# Patient Record
Sex: Male | Born: 1985
Health system: Southern US, Community
[De-identification: ages and names within clinical notes are randomized; demographics above are authoritative.]

## PROBLEM LIST (undated history)

## (undated) DIAGNOSIS — T7840XA Allergy, unspecified, initial encounter: Secondary | ICD-10-CM

## (undated) DIAGNOSIS — J3089 Other allergic rhinitis: Secondary | ICD-10-CM

## (undated) HISTORY — DX: Allergy, unspecified, initial encounter: T78.40XA

---

## 2012-02-13 ENCOUNTER — Ambulatory Visit (INDEPENDENT_AMBULATORY_CARE_PROVIDER_SITE_OTHER): Payer: 59 | Admitting: Family Medicine

## 2012-02-13 VITALS — BP 104/65 | HR 76 | Temp 98.2°F | Resp 16 | Ht 73.0 in | Wt 161.0 lb

## 2012-02-13 DIAGNOSIS — L0201 Cutaneous abscess of face: Secondary | ICD-10-CM

## 2012-02-13 DIAGNOSIS — Z23 Encounter for immunization: Secondary | ICD-10-CM

## 2012-02-13 MED ORDER — DOXYCYCLINE HYCLATE 100 MG PO CAPS: 100.0000 mg | ORAL_CAPSULE | Freq: Two times a day (BID) | ORAL | Status: DC

## 2012-02-13 NOTE — Progress Notes (Signed)
  Patient Name: Howard Campbell Date of Birth: 01-Dec-1985 Medical Record Number: [id number] Gender: male Date of Encounter: 02/13/2012  History of Present Illness:  Howard Campbell is a 26 y.o. very pleasant male patient who presents with the following:  Noted an "abscess" on his left cheek about 5 days ago. He thought it started with an ingrown hair or bug bite.  It drained yesterday and he removed "meat and pus" from the inside and there was a crater left. No fevers, chills- otherwise feels ok.  Taking ibuprofen as needed. He has continued to get some purulent drainage from the wound over the last couple of days.   The area seems better now than it did the last couple of days There is no problem list on file for this patient.  No past medical history on file. No past surgical history on file. History  Substance Use Topics  . Smoking status: Never Smoker   . Smokeless tobacco: Not on file  . Alcohol Use: Yes     social   No family history on file. No Known Allergies  Medication list has been reviewed and updated.  Review of Systems: As per HPI- otherwise negative. Generally healthy Unsure when his last tetanus shot was but not in the last several years  Physical Examination: Filed Vitals:   02/13/12 1523  BP: 104/65  Pulse: 76  Temp: 98.2 F (36.8 C)  TempSrc: Oral  Resp: 16  Height: 6\' 1"  (1.854 m)  Weight: 161 lb (73.029 kg)    Body mass index is 21.24 kg/(m^2).  GEN: WDWN, NAD, Non-toxic, A & O x 3 HEENT: Atraumatic, Normocephalic. Neck supple. No masses, No LAD.  Oropharynx wnl.  Left check shows a draining abscess. Removed band- aid and was able to express a small amount of pus from the wound.  There is a small defect in the skin but not enough space to place packing Ears and Nose: No external deformity. EXTR: No c/c/e NEURO Normal gait.  PSYCH: Normally interactive. Conversant. Not depressed or anxious appearing.  Calm demeanor.   Wound culture  collected Assessment and Plan: 1. Abscess of external cheek, left  doxycycline (VIBRAMYCIN) 100 MG capsule, Wound culture, Tdap vaccine greater than or equal to 7yo IM   Continue hot compresses several times daily, may squeeze GENTLY to remove any pus.  Take medication as directed and keep wound covered. Let me know if not continuing to improve, and we will contact him with wound culture results when available.

## 2012-02-17 LAB — WOUND CULTURE: Gram Stain: NONE SEEN

## 2012-08-30 ENCOUNTER — Other Ambulatory Visit: Payer: Self-pay | Admitting: Family Medicine

## 2012-08-30 NOTE — Telephone Encounter (Signed)
Please pull chart.

## 2012-08-31 NOTE — Telephone Encounter (Signed)
Chart pulled to PA pool DOS 05/17/11

## 2013-01-12 ENCOUNTER — Other Ambulatory Visit: Payer: Self-pay | Admitting: Family Medicine

## 2013-06-08 ENCOUNTER — Other Ambulatory Visit: Payer: Self-pay | Admitting: Physician Assistant

## 2013-06-08 NOTE — Telephone Encounter (Signed)
Needs OV.  No further RFs.  Last seen 2013

## 2013-10-29 ENCOUNTER — Other Ambulatory Visit: Payer: Self-pay | Admitting: Family Medicine

## 2013-11-16 ENCOUNTER — Other Ambulatory Visit: Payer: Self-pay | Admitting: Family Medicine

## 2014-01-05 ENCOUNTER — Other Ambulatory Visit: Payer: Self-pay | Admitting: Family Medicine

## 2014-01-07 ENCOUNTER — Telehealth: Payer: Self-pay

## 2014-01-07 NOTE — Telephone Encounter (Signed)
Patient is calling to get a refill on: acyclovir (ZOVIRAX) 400 MG tablet He was told by the Dr. He last saw that he can just call to get this refilled with no office visit.   Pharmacy: [facility]   Best: [phone]

## 2014-01-08 ENCOUNTER — Ambulatory Visit (INDEPENDENT_AMBULATORY_CARE_PROVIDER_SITE_OTHER): Payer: 59 | Admitting: Family Medicine

## 2014-01-08 VITALS — BP 124/76 | HR 70 | Temp 98.4°F | Resp 17 | Ht 72.0 in | Wt 146.0 lb

## 2014-01-08 DIAGNOSIS — A609 Anogenital herpesviral infection, unspecified: Secondary | ICD-10-CM

## 2014-01-08 DIAGNOSIS — B009 Herpesviral infection, unspecified: Secondary | ICD-10-CM

## 2014-01-08 MED ORDER — ACYCLOVIR 400 MG PO TABS: ORAL_TABLET | ORAL | Status: DC

## 2014-01-08 NOTE — Telephone Encounter (Signed)
Explained to pt that we can RF as long as he is seen at least once a year and is a current pt.  Provider needs to do general exam to make sure pt is healthy to Rx any medication. It has been almost 2 yrs since OV. Pt agreed to come in.

## 2014-01-08 NOTE — Progress Notes (Signed)
° °  Subjective:  This chart was scribed for Elvina SidleKurt Lauenstein, MD by Carl Bestelina Holson, Medical Scribe. This patient was seen in Room 12 and the patient's care was started at 5:56 PM.   Patient ID: Howard Campbell, male    DOB: 07/14/1986, 28 y.o.   MRN: [id number]  HPI HPI Comments: Howard Campbell is a 28 y.o. male who presents to the [facility] needing a refill for his Zovirax prescription.  Past Medical History  Diagnosis Date   Allergy    History reviewed. No pertinent past surgical history. History reviewed. No pertinent family history. History   Social History   Marital Status: Single    Spouse Name: N/A    Number of Children: N/A   Years of Education: N/A   Occupational History   Not on file.   Social History Main Topics   Smoking status: Never Smoker    Smokeless tobacco: Not on file   Alcohol Use: Yes     Comment: social   Drug Use: No   Sexual Activity: No   Other Topics Concern   Not on file   Social History Narrative   No narrative on file   No Known Allergies  Review of Systems  All other systems reviewed and are negative.     Objective:  Physical Exam     BP 124/76   Pulse 70   Temp(Src) 98.4 F (36.9 C) (Oral)   Resp 17   Ht 6' (1.829 m)   Wt 146 lb (66.225 kg)   BMI 19.80 kg/m2   SpO2 100% Assessment & Plan:  I personally performed the services described in this documentation, which was scribed in my presence. The recorded information has been reviewed and is accurate.  HSV (herpes simplex virus) anogenital infection  Signed, Elvina SidleKurt Lauenstein, MD

## 2014-11-28 ENCOUNTER — Ambulatory Visit (INDEPENDENT_AMBULATORY_CARE_PROVIDER_SITE_OTHER): Payer: BLUE CROSS/BLUE SHIELD | Admitting: Physician Assistant

## 2014-11-28 VITALS — BP 104/72 | HR 75 | Temp 98.3°F | Resp 16 | Ht 71.5 in | Wt 160.2 lb

## 2014-11-28 DIAGNOSIS — R1032 Left lower quadrant pain: Secondary | ICD-10-CM

## 2014-11-28 DIAGNOSIS — R109 Unspecified abdominal pain: Secondary | ICD-10-CM

## 2014-11-28 DIAGNOSIS — R369 Urethral discharge, unspecified: Secondary | ICD-10-CM

## 2014-11-28 DIAGNOSIS — A609 Anogenital herpesviral infection, unspecified: Secondary | ICD-10-CM

## 2014-11-28 DIAGNOSIS — R300 Dysuria: Secondary | ICD-10-CM

## 2014-11-28 LAB — POCT URINALYSIS DIPSTICK
BILIRUBIN UA: NEGATIVE
Blood, UA: NEGATIVE
GLUCOSE UA: NEGATIVE
KETONES UA: NEGATIVE
LEUKOCYTES UA: NEGATIVE
Nitrite, UA: NEGATIVE
PROTEIN UA: NEGATIVE
Spec Grav, UA: 1.025
Urobilinogen, UA: 0.2
pH, UA: 7

## 2014-11-28 LAB — CBC WITH DIFFERENTIAL/PLATELET
BASOS ABS: 0 10*3/uL (ref 0.0–0.1)
BASOS PCT: 0 % (ref 0–1)
EOS PCT: 1 % (ref 0–5)
Eosinophils Absolute: 0.1 10*3/uL (ref 0.0–0.7)
HCT: 46.9 % (ref 39.0–52.0)
Hemoglobin: 15.5 g/dL (ref 13.0–17.0)
Lymphocytes Relative: 28 % (ref 12–46)
Lymphs Abs: 1.5 10*3/uL (ref 0.7–4.0)
MCH: 27.2 pg (ref 26.0–34.0)
MCHC: 33 g/dL (ref 30.0–36.0)
MCV: 82.4 fL (ref 78.0–100.0)
MPV: 11.2 fL (ref 8.6–12.4)
Monocytes Absolute: 0.5 10*3/uL (ref 0.1–1.0)
Monocytes Relative: 9 % (ref 3–12)
Neutro Abs: 3.3 10*3/uL (ref 1.7–7.7)
Neutrophils Relative %: 62 % (ref 43–77)
PLATELETS: 212 10*3/uL (ref 150–400)
RBC: 5.69 MIL/uL (ref 4.22–5.81)
RDW: 14.3 % (ref 11.5–15.5)
WBC: 5.3 10*3/uL (ref 4.0–10.5)

## 2014-11-28 LAB — POCT UA - MICROSCOPIC ONLY
BACTERIA, U MICROSCOPIC: NEGATIVE
CASTS, UR, LPF, POC: NEGATIVE
CRYSTALS, UR, HPF, POC: NEGATIVE
EPITHELIAL CELLS, URINE PER MICROSCOPY: NEGATIVE
Mucus, UA: NEGATIVE
Yeast, UA: NEGATIVE

## 2014-11-28 LAB — COMPREHENSIVE METABOLIC PANEL
ALK PHOS: 41 U/L (ref 39–117)
ALT: 15 U/L (ref 0–53)
AST: 15 U/L (ref 0–37)
Albumin: 4.7 g/dL (ref 3.5–5.2)
BUN: 11 mg/dL (ref 6–23)
CALCIUM: 9.8 mg/dL (ref 8.4–10.5)
CHLORIDE: 103 meq/L (ref 96–112)
CO2: 32 mEq/L (ref 19–32)
CREATININE: 1.08 mg/dL (ref 0.50–1.35)
Glucose, Bld: 60 mg/dL — ABNORMAL LOW (ref 70–99)
Potassium: 4.2 mEq/L (ref 3.5–5.3)
Sodium: 141 mEq/L (ref 135–145)
Total Bilirubin: 0.6 mg/dL (ref 0.2–1.2)
Total Protein: 7.5 g/dL (ref 6.0–8.3)

## 2014-11-28 MED ORDER — ACYCLOVIR 400 MG PO TABS: ORAL_TABLET | ORAL | Status: DC

## 2014-11-28 NOTE — Progress Notes (Signed)
Subjective:    Patient ID: Howard BruinsShawn F Campbell, male    DOB: [birth year], 29 y.o.   MRN: [id number]  Chief Complaint  Patient presents with  . Abdominal Pain    x2-3 weeks; on left side   . Flank Pain  . Dysuria  . Urinary Frequency  . Penile Discharge    states his discharge is white   There are no active problems to display for this patient.  Prior to Admission medications   Medication Sig Start Date End Date Taking? Authorizing Provider  acyclovir (ZOVIRAX) 400 MG tablet Take 1 tab by mouth 1-2 times per day. 11/28/14  Yes Raelyn Ensignodd Osborne Serio, PA   Medications, allergies, past medical history, surgical history, family history, social history and problem list reviewed and updated.  HPI  [age] yom with pmh herpes simplex infx 6 yrs ago presents with 3 wk h/o left sided abd pain, flank pain, dysuria, and white penile dc.  He actually states sx started approx 4 months ago with llq pain. This has been constant for four months. No change with eating, bm, movement, etc. Rated 3/10. No radiation. He has not taken anything for the pain. He has also had bilateral low back pain for approx the same time. This is intermittent and lasts for hrs to days when it comes on. No radiation. No aggravating, relieving factors.   Had one episode white penile dc one month ago. Was also having itchiness with urination during this time. He took 3 days of his wife's doxy which relieved to sx. Sx returned few days later, took doxy for 5 days and sx resolved until yest.   Since yest having white penile dc, penile itching while peeing. No dysuria. Mild increased freq. No straining, no urgency. No increased thirst.   Denies constipation, diarrhea, fever, chills. Has not been lifting weights. Denies erectile probs.   Has hx genital herpes infx 6 yrs ago. Has used acyclovir intermittently since. Needs refill today.   He is married and initially states wife is only partner. At end of interview he clarifies that he has been  having oral sex for several months with another partner.   Review of Systems No cp, sob. No abd pain.     Objective:   Physical Exam  Constitutional: He is oriented to person, place, and time. He appears well-developed and well-nourished.  Non-toxic appearance. He does not have a sickly appearance. He does not appear ill. No distress.  BP 104/72 mmHg  Pulse 75  Temp(Src) 98.3 F (36.8 C) (Oral)  Resp 16  Ht 5' 11.5" (1.816 m)  Wt 160 lb 3.2 oz (72.666 kg)  BMI 22.03 kg/m2  SpO2 100%   Cardiovascular: Normal rate, regular rhythm and normal heart sounds.   Pulmonary/Chest: Effort normal and breath sounds normal.  Abdominal: Soft. Normal appearance and bowel sounds are normal. He exhibits no mass. There is no hepatosplenomegaly. There is no tenderness. There is no rigidity, no rebound, no guarding, no CVA tenderness, no tenderness at McBurney's point and negative Murphy's sign. No hernia. Hernia confirmed negative in the right inguinal area and confirmed negative in the left inguinal area.  No ttp llq.   Genitourinary: Testes normal. Right testis shows no swelling and no tenderness. Left testis shows no swelling and no tenderness. Discharge found.  Small amnt white dc at tip of penis.   Musculoskeletal:       Lumbar back: Normal. He exhibits normal range of motion, no tenderness, no bony tenderness, no deformity  and no spasm.  No ttp over area that he states has been sore.   Lymphadenopathy:       Right: No inguinal adenopathy present.       Left: No inguinal adenopathy present.  Neurological: He is alert and oriented to person, place, and time.  Psychiatric: He has a normal mood and affect. His speech is normal.   Results for orders placed or performed in visit on 11/28/14  POCT UA - Microscopic Only  Result Value Ref Range   WBC, Ur, HPF, POC 0-1    RBC, urine, microscopic 4-8    Bacteria, U Microscopic neg    Mucus, UA neg    Epithelial cells, urine per micros neg     Crystals, Ur, HPF, POC neg    Casts, Ur, LPF, POC neg    Yeast, UA neg   POCT urinalysis dipstick  Result Value Ref Range   Color, UA yellow    Clarity, UA clear    Glucose, UA neg    Bilirubin, UA neg    Ketones, UA neg    Spec Grav, UA 1.025    Blood, UA neg    pH, UA 7.0    Protein, UA neg    Urobilinogen, UA 0.2    Nitrite, UA neg    Leukocytes, UA Negative       Assessment & Plan:   67 yom with pmh herpes simplex infx 6 yrs ago presents with 3 wk h/o left sided abd pain, flank pain, dysuria, and white penile dc.  Dysuria - Plan: POCT UA - Microscopic Only, POCT urinalysis dipstick, CBC with Differential/Platelet --actually more penile itching and very mild increased freq --neg ua  Flank pain - Plan: POCT UA - Microscopic Only, POCT urinalysis dipstick, Comprehensive metabolic panel, CBC with Differential/Platelet Abdominal pain, left lower quadrant - Plan: Comprehensive metabolic panel, CBC with Differential/Platelet --doubt pyelo, stone as pain is bilateral, normal ua and pain is bilateral --low concern with abd pain as abd exam is completely benign  --await cmp, cbc  Penile discharge - Plan: GC/Chlamydia Probe Amp --await gc/ct --if neg may tx with flagyl for poss trich infx, tx wife as well   HSV (herpes simplex virus) anogenital infection - Plan: acyclovir (ZOVIRAX) 400 MG tablet --no current outbreak --refill given  Donnajean Lopes, PA-C Physician Assistant-Certified [facility]  11/28/2014 6:21 PM

## 2014-11-28 NOTE — Patient Instructions (Signed)
Your urine sample was negative for a urinary tract infection. We tested you for chlamydia and gonorrhea today and I'll be in contact with you with those results. If they are negative I may send in an antibiotic to treat you for something called trich. I'll let you know if I do this.  We drew labs today checking your liver, kidneys, blood sugar, electrolytes, and blood cells. I'll let you know the results of this.  If you change your mind about HIV, syphilis testing please let us know.

## 2014-11-29 ENCOUNTER — Ambulatory Visit (INDEPENDENT_AMBULATORY_CARE_PROVIDER_SITE_OTHER): Payer: BLUE CROSS/BLUE SHIELD | Admitting: Physician Assistant

## 2014-11-29 ENCOUNTER — Telehealth: Payer: Self-pay | Admitting: Physician Assistant

## 2014-11-29 ENCOUNTER — Other Ambulatory Visit: Payer: Self-pay | Admitting: Physician Assistant

## 2014-11-29 VITALS — HR 64 | Temp 97.8°F | Resp 16 | Ht 72.0 in | Wt 159.0 lb

## 2014-11-29 DIAGNOSIS — A549 Gonococcal infection, unspecified: Secondary | ICD-10-CM

## 2014-11-29 DIAGNOSIS — Z113 Encounter for screening for infections with a predominantly sexual mode of transmission: Secondary | ICD-10-CM

## 2014-11-29 LAB — GC/CHLAMYDIA PROBE AMP
CT Probe RNA: NEGATIVE
GC Probe RNA: POSITIVE — AB

## 2014-11-29 MED ORDER — CEFTRIAXONE SODIUM 250 MG IJ SOLR: 250.0000 mg | Freq: Once | INTRAMUSCULAR | Status: DC

## 2014-11-29 MED ORDER — CEFTRIAXONE SODIUM 1 G IJ SOLR
250.0000 mg | Freq: Once | INTRAMUSCULAR | Status: AC
  Administered 2014-11-29: 250 mg via INTRAMUSCULAR

## 2014-11-29 MED ORDER — AZITHROMYCIN 250 MG PO TABS: ORAL_TABLET | ORAL | Status: DC

## 2014-11-29 NOTE — Telephone Encounter (Signed)
Lab - pls send health dept card.   Called and spoke with pt. His gonorrhea test was positive. Instructed to rtc today for IM ceft and oral azithro. Pt instructed to inform partners and have them come in to be tx. Pt instructed that we encourage HIV/syphilis testing and he is agreeable to this, will check today. Pt instructed to withhold from sex for one week post treatment. Pt instructed to rtc 3 days if sx don't resolve. Pt instructed to rtc 3 months for recheck. Pt expressed understanding and is agreeable.

## 2014-11-29 NOTE — Progress Notes (Signed)
Patient here for treatment of gonorrhea in light of positive uri probe.  Received 250 mg IM ceftriaxone. Chlamydia negative.  Patient STI panel completed today with lab work for HIV, syphilis, and Hep C testing.  Will follow those results.  Deliah BostonMichael Kayleeann Huxford, MS, PA-C   5:17 PM, 11/29/2014

## 2014-11-30 LAB — RPR

## 2014-11-30 LAB — HIV ANTIBODY (ROUTINE TESTING W REFLEX): HIV 1&2 Ab, 4th Generation: NONREACTIVE

## 2014-11-30 LAB — HEPATITIS C ANTIBODY: HCV Ab: NEGATIVE

## 2014-11-30 NOTE — Telephone Encounter (Signed)
[facility] form faxed.

## 2016-10-15 ENCOUNTER — Encounter: Payer: Self-pay | Admitting: Physician Assistant

## 2016-10-15 ENCOUNTER — Ambulatory Visit (INDEPENDENT_AMBULATORY_CARE_PROVIDER_SITE_OTHER): Payer: Managed Care, Other (non HMO) | Admitting: Physician Assistant

## 2016-10-15 VITALS — BP 112/76 | HR 68 | Temp 98.6°F | Resp 18 | Ht 72.0 in | Wt 167.0 lb

## 2016-10-15 DIAGNOSIS — A609 Anogenital herpesviral infection, unspecified: Secondary | ICD-10-CM

## 2016-10-15 DIAGNOSIS — R6889 Other general symptoms and signs: Secondary | ICD-10-CM

## 2016-10-15 LAB — POCT URINALYSIS DIP (MANUAL ENTRY)
Bilirubin, UA: NEGATIVE
Blood, UA: NEGATIVE
Glucose, UA: NEGATIVE
Ketones, POC UA: NEGATIVE
Leukocytes, UA: NEGATIVE
Nitrite, UA: NEGATIVE
Protein Ur, POC: NEGATIVE
Spec Grav, UA: >=1.03
Urobilinogen, UA: 0.2
pH, UA: 5.5

## 2016-10-15 LAB — POC MICROSCOPIC URINALYSIS (UMFC): Mucus: ABSENT

## 2016-10-15 MED ORDER — ACYCLOVIR 400 MG PO TABS: ORAL_TABLET | ORAL | 3 refills | Status: DC

## 2016-10-15 NOTE — Patient Instructions (Addendum)
Please be sure to practice safe sex: Use condoms.  Please stay well hydrated, drink at least 2 liters of water a day.  I will contact you with your results when they come back.    Thank you for coming in today. I hope you feel we met your needs.  Feel free to call [facility] if you have any questions or further requests.  Please consider signing up for MyChart if you do not already have it, as this is a great way to communicate with me.  Best,  Whitney McVey, PA-C   IF you received an x-ray today, you will receive an invoice from [facility]. Please contact [facility] at [phone] with questions or concerns regarding your invoice.   IF you received labwork today, you will receive an invoice from [facility]. Please contact [facility] at [phone] with questions or concerns regarding your invoice.   Our billing staff will not be able to assist you with questions regarding bills from these companies.  You will be contacted with the lab results as soon as they are available. The fastest way to get your results is to activate your My Chart account. Instructions are located on the last page of this paperwork. If you have not heard from [place] regarding the results in 2 weeks, please contact this office.

## 2016-10-15 NOTE — Progress Notes (Signed)
Howard Campbell  MRN: [id number] DOB: Jan 20, 1986  PCP: Howard Campbell  Subjective:  Pt is a 30 year old male who presents to clinic for STD testing. For the past few weeks he has experiences intermittent "cold or wet" feeling of his testes and penis, happens most often after he urinates, sometimes after he has been sitting for a while. Is not constant. This has happened to him a few years ago, his symptoms went away on their own. Endorses some mild lower left abdominal pain, however he works out a lot and thinks this could perhaps be from exercising.  He has intercourse with one partner, uses condoms. Has tested positive and treated for STIs in the past. He states his current symptoms are unlike those in the past when he has STI.  Denies fever, chills, discharge from penis, testicular pain, penile pain, low back pain, flank pain, blood in urine.   Review of Systems  Constitutional: Negative for chills, diaphoresis and fever.  Respiratory: Negative for cough, chest tightness, shortness of breath and wheezing.   Cardiovascular: Negative for chest pain and palpitations.  Gastrointestinal: Negative for diarrhea, nausea and vomiting.  Genitourinary: Negative for decreased urine volume, difficulty urinating, discharge, dysuria, enuresis, flank pain, frequency, penile pain, penile swelling, scrotal swelling, testicular pain and urgency.  Musculoskeletal: Negative for joint swelling.  Neurological: Negative for dizziness, syncope, light-headedness and headaches.    Patient Active Problem List   Diagnosis Date Noted  . Gonorrhea 11/29/2014    Current Outpatient Prescriptions on File Prior to Visit  Medication Sig Dispense Refill  . acyclovir (ZOVIRAX) 400 MG tablet Take 1 tab by mouth 1-2 times per day. 90 tablet 3   No current facility-administered medications on file prior to visit.     No Known Allergies   Objective:  BP 112/76 (BP Location: Right Arm, Patient Position:  Sitting, Cuff Size: Small)   Pulse 68   Temp 98.6 F (37 C) (Oral)   Resp 18   Ht 6' (1.829 m)   Wt 167 lb (75.8 kg)   SpO2 100%   BMI 22.65 kg/m   Physical Exam  Constitutional: He is oriented to person, place, and time and well-developed, well-nourished, and in no distress. No distress.  Cardiovascular: Normal rate, regular rhythm and normal heart sounds.   Pulmonary/Chest: Effort normal. No respiratory distress.  Neurological: He is alert and oriented to person, place, and time. GCS score is 15.  Skin: Skin is warm and dry.  Psychiatric: Mood, memory, affect and judgment normal.  Vitals reviewed.  Results for orders placed or performed in visit on 10/15/16  GC/Chlamydia Probe Amp  Result Value Ref Range   Chlamydia trachomatis, NAA Negative Negative   Neisseria gonorrhoeae by PCR Negative Negative  Urine culture  Result Value Ref Range   Urine Culture, Routine Final report    Urine Culture result 1 No growth   PSA  Result Value Ref Range   Prostate Specific Ag, Serum 0.8 0.0 - 4.0 ng/mL  HIV antibody (with reflex)  Result Value Ref Range   HIV Screen 4th Generation wRfx Non Reactive Non Reactive  Testosterone  Result Value Ref Range   Testosterone 696 264 - 916 ng/dL  POCT urinalysis dipstick  Result Value Ref Range   Color, UA yellow yellow   Clarity, UA clear clear   Glucose, UA negative negative   Bilirubin, UA negative negative   Ketones, POC UA negative negative   Spec Grav, UA >=1.030  Blood, UA negative negative   pH, UA 5.5    Protein Ur, POC negative negative   Urobilinogen, UA 0.2    Nitrite, UA Negative Negative   Leukocytes, UA Negative Negative  POCT Microscopic Urinalysis ([facility])  Result Value Ref Range   WBC,UR,HPF,POC None None WBC/hpf   RBC,UR,HPF,POC None None RBC/hpf   Bacteria None None, Too numerous to count   Mucus Absent Absent   Epithelial Cells, UR Per Microscopy Few (A) None, Too numerous to count cells/hpf    Assessment and  Plan :  1. Sensation of feeling cold - GC/Chlamydia Probe Amp - POCT urinalysis dipstick - POCT Microscopic Urinalysis ([facility]) - PSA - Urine culture - HIV antibody (with reflex) - Testosterone - No concern at this time for prostatitis or UTI. Labs pending, will contact with results.   2. HSV (herpes simplex virus) anogenital infection - acyclovir (ZOVIRAX) 400 MG tablet; Take 1 tab by mouth 1-2 times per day.  Dispense: 90 tablet; Refill: 3    Howard CollieWhitney Kiandria Clum, PA-C  [facility] 10/15/2016 8:20 AM

## 2016-10-16 LAB — HIV ANTIBODY (ROUTINE TESTING W REFLEX): HIV Screen 4th Generation wRfx: NONREACTIVE

## 2016-10-16 LAB — TESTOSTERONE: Testosterone: 696 ng/dL (ref 264–916)

## 2016-10-16 LAB — PSA: Prostate Specific Ag, Serum: 0.8 ng/mL (ref 0.0–4.0)

## 2016-10-17 LAB — GC/CHLAMYDIA PROBE AMP
Chlamydia trachomatis, NAA: NEGATIVE
Neisseria gonorrhoeae by PCR: NEGATIVE

## 2016-10-17 LAB — URINE CULTURE: Organism ID, Bacteria: NO GROWTH

## 2016-10-20 ENCOUNTER — Telehealth: Payer: Self-pay

## 2016-10-20 NOTE — Telephone Encounter (Signed)
Pt requesting lab results Please post

## 2016-10-20 NOTE — Telephone Encounter (Signed)
Pt is looking for lab results   Best number [phone]

## 2016-10-21 NOTE — Telephone Encounter (Signed)
PATIENT STATES HE WAS SEEN A WEEK AGO AND HAD SOME LAB WORK DONE. HE SAW ELIZABETH MCVEY. HE SAID HE WAS TOLD HE WOULD GET A CALL REGARDING HIS LAB RESULTS THE NEXT DAY. HE SAID HE HAS BEEN WAITING AND CALLING AND NO ONE HAS CALLED HIM BACK  HE WOULD LIKE TO GO AHEAD AND GET ON MEDICATION IF HE NEEDS TO. BEST PHONE [phone] (CELL)  [facility] [address]

## 2016-10-22 ENCOUNTER — Encounter: Payer: Self-pay | Admitting: Physician Assistant

## 2016-10-22 NOTE — Telephone Encounter (Signed)
I told pt I'd call if +results. Please call and let him know all lab work is negative. No need to treat. There is a letter in the mail with all of his results.

## 2016-10-23 NOTE — Telephone Encounter (Signed)
Pt advised.

## 2016-12-14 ENCOUNTER — Telehealth: Payer: Self-pay

## 2016-12-14 NOTE — Telephone Encounter (Signed)
Pt still feels like he has Chlamydia even though his lab results were negative.  His partner told him she has it. He would like a script for this. He would like a CB, he doesn't want to come in for an OV. I tried to explain he would need to be seen. Please advise at [phone]

## 2016-12-15 ENCOUNTER — Ambulatory Visit (INDEPENDENT_AMBULATORY_CARE_PROVIDER_SITE_OTHER): Payer: 59 | Admitting: Family Medicine

## 2016-12-15 VITALS — BP 156/63 | HR 62 | Temp 98.3°F | Ht 72.0 in | Wt 168.8 lb

## 2016-12-15 DIAGNOSIS — Z202 Contact with and (suspected) exposure to infections with a predominantly sexual mode of transmission: Secondary | ICD-10-CM

## 2016-12-15 LAB — POCT URINALYSIS DIP (MANUAL ENTRY)
BILIRUBIN UA: NEGATIVE
Blood, UA: NEGATIVE
Glucose, UA: NEGATIVE
Ketones, POC UA: NEGATIVE
LEUKOCYTES UA: NEGATIVE
NITRITE UA: NEGATIVE
PH UA: 7
Protein Ur, POC: 30 — AB
Spec Grav, UA: 1.02
Urobilinogen, UA: 0.2

## 2016-12-15 MED ORDER — AZITHROMYCIN 500 MG PO TABS: ORAL_TABLET | ORAL | 0 refills | Status: DC

## 2016-12-15 NOTE — Telephone Encounter (Signed)
I've reviewed the chart.  I will see him today in clinic anytime from 1-7 or all day Saturday. No antibiotics over the phone. Deliah BostonMichael Thoms Barthelemy, MS, PA-C 10:15 AM, 12/15/2016

## 2016-12-15 NOTE — Patient Instructions (Addendum)
Azithromycin 500 mg 2 tablets once.  You will be notified within one week of your lab results.    IF you received an x-ray today, you will receive an invoice from [facility]. Please contact [facility] at [phone] with questions or concerns regarding your invoice.   IF you received labwork today, you will receive an invoice from [facility]. Please contact [facility] at [phone] with questions or concerns regarding your invoice.   Our billing staff will not be able to assist you with questions regarding bills from these companies.  You will be contacted with the lab results as soon as they are available. The fastest way to get your results is to activate your My Chart account. Instructions are located on the last page of this paperwork. If you have not heard from us regarding the results in 2 weeks, please contact this office.     Chlamydia, Male Chlamydia is an STD (sexually transmitted disease). It is a bacterial infection that spreads through sexual contact (is contagious). Chlamydia can occur in different areas of the body, including the tube that moves urine from the bladder out of the body (urethra), the throat, or the rectum. This condition is not difficult to treat. However, if left untreated, chlamydia can lead to more serious health problems. What are the causes? Chlamydia is caused by the bacteria Chlamydia trachomatis. It is passed from an infected partner during sexual activity. Chlamydia can spread through contact with the genitals, mouth, or rectum. What are the signs or symptoms? In some cases, there may not be any symptoms for this condition (asymptomatic), especially early in the infection. If symptoms develop, they may include:  Burning when urinating.  Urinating frequently.  Pain or swelling in the testicles.  Watery, mucus-like discharge from the penis.  Redness, soreness, and swelling (inflammation) of the rectum.  Bleeding or discharge  from the rectum.  Abdominal pain.  Itching, burning, or redness in the eyes, or discharge from the eyes. How is this diagnosed? This condition may be diagnosed based on:  Urine tests.  Swab tests. Depending on your symptoms, your health care provider may use a cotton swab to collect discharge from your urethra or rectum to test for the bacteria. How is this treated? This condition is treated with antibiotic medicines. Follow these instructions at home: Medicines   Take over-the-counter and prescription medicines only as told by your health care provider.  Take your antibiotic medicine as told by your health care provider. Do not stop taking the antibiotic even if you start to feel better. Sexual activity   Tell sexual partners about your infection. This includes any oral, anal, or vaginal sex partners you have had within 60 days of when your symptoms started. Sexual partners should also be treated, even if they have no signs of the disease.  Do not have sex until you and your sexual partners have completed treatment and your health care provider says it is okay. If your health care provider prescribed you a single dose treatment, wait 7 days after taking the treatment before having sex. General instructions   It is your responsibility to get your test results. Ask your health care provider, or the department performing the test, when your results will be ready.  Get plenty of rest.  Eat a healthy, well-balanced diet.  Drink enough fluids to keep your urine clear or pale yellow.  Keep all follow-up visits as told by your health care provider. This is important. You may need to be  tested for infection again 3 months after treatment. How is this prevented? The only sure way to prevent chlamydia is to avoid sexual intercourse. However, you can lower your risk by:  Using latex condoms correctly every time you have sexual intercourse.  Not having multiple sexual partners.  Asking  if your sexual partner has been tested for STIs and had negative results. Contact a health care provider if:  You develop new symptoms or your symptoms do not get better after completing treatment.  You have a fever or chills.  You have pain during sexual intercourse.  You develop new joint pain or swelling near your joints.  You have pain or soreness in your testicles. Get help right away if:  Your pain gets worse and does not get better with medicine.  You have abnormal discharge.  You develop flu-like symptoms, such as night sweats, sore throat, or muscle aches. Summary  Chlamydia is an STD (sexually transmitted disease). It is a bacterial infection that spreads (is contagious) through sexual contact.  This condition is not difficult to treat, however, if left untreated, it can lead to more serious health problems.  In some cases, there may not be any symptoms for this condition (asymptomatic).  This condition is treated with antibiotic medicines.  Using latex condoms correctly every time you have sexual intercourse can help prevent chlamydia. This information is not intended to replace advice given to you by your health care provider. Make sure you discuss any questions you have with your health care provider. Document Released: 10/04/2005 Document Revised: 09/20/2016 Document Reviewed: 09/20/2016 Elsevier Interactive Patient Education  2017 [employer].

## 2016-12-15 NOTE — Progress Notes (Signed)
   Patient ID: Howard Campbell, male    DOB: 04-26-1986, 31 y.o.   MRN: [id number]  PCP: Tally DueGUEST, CHRIS WARREN, MD  Chief Complaint  Patient presents with  . Exposure to STD    Subjective:  HPI 31 year old male presents for evaluation of known exposure to STD.  He was last tested for STD in December and all results were negative. Past sexual partner recently told him that she tested positive for chlamydia and was treated.  He reports that he has not been sexually active with this partner recently but would like to be treated for STD. Reports urinary frequency although denies penile discharge pain.  Social History   Social History  . Marital status: Single    Spouse name: N/A  . Number of children: N/A  . Years of education: N/A   Occupational History  . Not on file.   Social History Main Topics  . Smoking status: Never Smoker  . Smokeless tobacco: Never Used  . Alcohol use Yes     Comment: social  . Drug use: No  . Sexual activity: Yes     Comment: Wife, been married 3-4 yrs   Other Topics Concern  . Not on file   Social History Narrative  . No narrative on file   Review of Systems HPI  Patient Active Problem List   Diagnosis Date Noted  . Gonorrhea 11/29/2014    No Known Allergies  Prior to Admission medications   Medication Sig Start Date End Date Taking? Authorizing Provider  acyclovir (ZOVIRAX) 400 MG tablet Take 1 tab by mouth 1-2 times per day. 10/15/16  Yes Madelaine BhatElizabeth Whitney McVey, PA-C    Past Medical, Surgical Family and Social History reviewed and updated.    Objective:   Today's Vitals   12/15/16 1538  BP: (!) 156/63  Pulse: 62  Temp: 98.3 F (36.8 C)  TempSrc: Oral  SpO2: 100%  Weight: 168 lb 12.8 oz (76.6 kg)  Height: 6' (1.829 m)    Wt Readings from Last 3 Encounters:  12/15/16 168 lb 12.8 oz (76.6 kg)  10/15/16 167 lb (75.8 kg)  11/29/14 159 lb (72.1 kg)   Physical Exam  Constitutional: He is oriented to person, place, and  time. He appears well-developed and well-nourished.  HENT:  Head: Normocephalic and atraumatic.  Cardiovascular: Normal rate, regular rhythm, normal heart sounds and intact distal pulses.   Pulmonary/Chest: Effort normal and breath sounds normal.  Neurological: He is alert and oriented to person, place, and time.  Psychiatric: He has a normal mood and affect. His behavior is normal. Judgment and thought content normal.     Assessment & Plan:  1. STD exposure - GC/Chlamydia Probe Amp - POCT urinalysis dipstick Treating empirically, for exposure to Chlamydia.  Plan: Azithromycin 1000 mg once.  You will be notified of your lab results.  Godfrey PickKimberly S. Tiburcio PeaHarris, MSN, FNP-C Primary Care at [facility] [phone]

## 2016-12-17 LAB — GC/CHLAMYDIA PROBE AMP
CHLAMYDIA, DNA PROBE: NEGATIVE
NEISSERIA GONORRHOEAE BY PCR: NEGATIVE

## 2020-08-21 ENCOUNTER — Other Ambulatory Visit: Payer: Self-pay

## 2020-08-21 ENCOUNTER — Encounter: Payer: Self-pay | Admitting: Family Medicine

## 2020-08-21 ENCOUNTER — Ambulatory Visit: Payer: 59 | Admitting: Family Medicine

## 2020-08-21 ENCOUNTER — Other Ambulatory Visit (HOSPITAL_COMMUNITY)
Admission: RE | Admit: 2020-08-21 | Discharge: 2020-08-21 | Disposition: A | Discharge status: Home / Self Care | Payer: 59 | Source: Ambulatory Visit | Attending: Family Medicine | Admitting: Family Medicine

## 2020-08-21 VITALS — BP 132/81 | HR 85 | Temp 97.5°F | Ht 72.0 in | Wt 171.6 lb

## 2020-08-21 DIAGNOSIS — R1032 Left lower quadrant pain: Secondary | ICD-10-CM

## 2020-08-21 DIAGNOSIS — A609 Anogenital herpesviral infection, unspecified: Secondary | ICD-10-CM | POA: Diagnosis not present

## 2020-08-21 DIAGNOSIS — R3915 Urgency of urination: Secondary | ICD-10-CM | POA: Insufficient documentation

## 2020-08-21 DIAGNOSIS — R109 Unspecified abdominal pain: Secondary | ICD-10-CM | POA: Insufficient documentation

## 2020-08-21 DIAGNOSIS — F439 Reaction to severe stress, unspecified: Secondary | ICD-10-CM

## 2020-08-21 DIAGNOSIS — Z113 Encounter for screening for infections with a predominantly sexual mode of transmission: Secondary | ICD-10-CM

## 2020-08-21 DIAGNOSIS — R350 Frequency of micturition: Secondary | ICD-10-CM

## 2020-08-21 LAB — POCT URINALYSIS DIP (CLINITEK)
Bilirubin, UA: NEGATIVE
Blood, UA: NEGATIVE
Glucose, UA: NEGATIVE mg/dL
Leukocytes, UA: NEGATIVE
Nitrite, UA: NEGATIVE
POC PROTEIN,UA: NEGATIVE
Spec Grav, UA: 1.025 (ref 1.010–1.025)
Urobilinogen, UA: 0.2 E.U./dL
pH, UA: 5.5 (ref 5.0–8.0)

## 2020-08-21 LAB — POC MICROSCOPIC URINALYSIS (UMFC): Mucus: ABSENT

## 2020-08-21 LAB — PSA

## 2020-08-21 MED ORDER — ACYCLOVIR 400 MG PO TABS: ORAL_TABLET | ORAL | 3 refills | Status: DC

## 2020-08-21 NOTE — Patient Instructions (Addendum)
For genital herpes outbreak - 1 pill three times per day for 3 days (or 2 pills three times per day for 2 days).  See information below on stress.  Tylenol PM or melatonin can help with sleep.  Follow-up if that is not improving and let me know if you need further resources.  In office urine test looked okay, including no blood.  I will check some other tests including blood count.  Tylenol as needed for soreness in case that could be related to muscle or abdominal wall.  Recheck in the next 1 to 2 weeks to decide on next step for your flank/abdominal pain.  If any acute worsening of symptoms prior to that time be seen here, urgent care or emergency room.   Flank Pain, Adult Flank pain is pain that is located on the side of the body between the upper abdomen and the back. This area is called the flank. The pain may occur over a short period of time (acute), or it may be long-term or recurring (chronic). It may be mild or severe. Flank pain can be caused by many things, including:  Muscle soreness or injury.  Kidney stones or kidney disease.  Stress.  A disease of the spine (vertebral disk disease).  A lung infection (pneumonia).  Fluid around the lungs (pulmonary edema).  A skin rash caused by the chickenpox virus (shingles).  Tumors that affect the back of the abdomen.  Gallbladder disease. Follow these instructions at home:   Drink enough fluid to keep your urine clear or pale yellow.  Rest as told by your health care provider.  Take over-the-counter and prescription medicines only as told by your health care provider.  Keep a journal to track what has caused your flank pain and what has made it feel better.  Keep all follow-up visits as told by your health care provider. This is important. Contact a health care provider if:  Your pain is not controlled with medicine.  You have new symptoms.  Your pain gets worse.  You have a fever.  Your symptoms last longer than  2-3 days.  You have trouble urinating or you are urinating very frequently. Get help right away if:  You have trouble breathing or you are short of breath.  Your abdomen hurts or it is swollen or red.  You have nausea or vomiting.  You feel faint or you pass out.  You have blood in your urine. Summary  Flank pain is pain that is located on the side of the body between the upper abdomen and the back.  The pain may occur over a short period of time (acute), or it may be long-term or recurring (chronic). It may be mild or severe.  Flank pain can be caused by many things.  Contact your health care provider if your symptoms get worse or they last longer than 2-3 days. This information is not intended to replace advice given to you by your health care provider. Make sure you discuss any questions you have with your health care provider. Document Revised: 09/16/2017 Document Reviewed: 12/17/2016 Elsevier Patient Education  2020 [place], Adult Stress is a normal reaction to life events. Stress is what you feel when life demands more than you are used to, or more than you think you can handle. Some stress can be useful, such as studying for a test or meeting a deadline at work. Stress that occurs too often or for too long can cause problems.  It can affect your emotional health and interfere with relationships and normal daily activities. Too much stress can weaken your body's defense system (immune system) and increase your risk for physical illness. If you already have a medical problem, stress can make it worse. What are the causes? All sorts of life events can cause stress. An event that causes stress for one person may not be stressful for another person. Major life events, whether positive or negative, commonly cause stress. Examples include:  Losing a job or starting a new job.  Losing a loved one.  Moving to a new town or home.  Getting married or  divorced.  Having a baby.  Getting injured or sick. Less obvious life events can also cause stress, especially if they occur day after day or in combination with each other. Examples include:  Working long hours.  Driving in traffic.  Caring for children.  Being in debt.  Being in a difficult relationship. What are the signs or symptoms? Stress can cause emotional symptoms, including:  Anxiety. This is feeling worried, afraid, on edge, overwhelmed, or out of control.  Anger, including irritation or impatience.  Depression. This is feeling sad, down, helpless, or guilty.  Trouble focusing, remembering, or making decisions. Stress can cause physical symptoms, including:  Aches and pains. These may affect your head, neck, back, stomach, or other areas of your body.  Tight muscles or a clenched jaw.  Low energy.  Trouble sleeping. Stress can cause unhealthy behaviors, including:  Eating to feel better (overeating) or skipping meals.  Working too much or putting off tasks.  Smoking, drinking alcohol, or using drugs to feel better. How is this diagnosed? Stress is diagnosed through an assessment by your health care provider. He or she may diagnose this condition based on:  Your symptoms and any stressful life events.  Your medical history.  Tests to rule out other causes of your symptoms. Depending on your condition, your health care provider may refer you to a specialist for further evaluation. How is this treated?  Stress management techniques are the recommended treatment for stress. Medicine is not typically recommended for the treatment of stress. Techniques to reduce your reaction to stressful life events include:  Stress identification. Monitor yourself for symptoms of stress and identify what causes stress for you. These skills may help you to avoid or prepare for stressful events.  Time management. Set your priorities, keep a calendar of events, and learn  to say no. Taking these actions can help you avoid making too many commitments. Techniques for coping with stress include:  Rethinking the problem. Try to think realistically about stressful events rather than ignoring them or overreacting. Try to find the positives in a stressful situation rather than focusing on the negatives.  Exercise. Physical exercise can release both physical and emotional tension. The key is to find a form of exercise that you enjoy and do it regularly.  Relaxation techniques. These relax the body and mind. The key is to find one or more that you enjoy and use the techniques regularly. Examples include: ? Meditation, deep breathing, or progressive relaxation techniques. ? Yoga or tai chi. ? Biofeedback, mindfulness techniques, or journaling. ? Listening to music, being out in nature, or participating in other hobbies.  Practicing a healthy lifestyle. Eat a balanced diet, drink plenty of water, limit or avoid caffeine, and get plenty of sleep.  Having a strong support network. Spend time with family, friends, or other people you enjoy being  around. Express your feelings and talk things over with someone you trust. Counseling or talk therapy with a mental health professional may be helpful if you are having trouble managing stress on your own. Follow these instructions at home: Lifestyle   Avoid drugs.  Do not use any products that contain nicotine or tobacco, such as cigarettes, e-cigarettes, and chewing tobacco. If you need help quitting, ask your health care provider.  Limit alcohol intake to no more than 1 drink a day for nonpregnant women and 2 drinks a day for men. One drink equals 12 oz of beer, 5 oz of wine, or 1 oz of hard liquor  Do not use alcohol or drugs to relax.  Eat a balanced diet that includes fresh fruits and vegetables, whole grains, lean meats, fish, eggs, and beans, and low-fat dairy. Avoid processed foods and foods high in added fat, sugar,  and salt.  Exercise at least 30 minutes on 5 or more days each week.  Get 7-8 hours of sleep each night. General instructions   Practice stress management techniques as discussed with your health care provider.  Drink enough fluid to keep your urine clear or pale yellow.  Take over-the-counter and prescription medicines only as told by your health care provider.  Keep all follow-up visits as told by your health care provider. This is important. Contact a health care provider if:  Your symptoms get worse.  You have new symptoms.  You feel overwhelmed by your problems and can no longer manage them on your own. Get help right away if:  You have thoughts of hurting yourself or others. If you ever feel like you may hurt yourself or others, or have thoughts about taking your own life, get help right away. You can go to your nearest emergency department or call:  Your local emergency services (911 in the [place]  A suicide crisis helpline, such as the [facility] at 819-494-7513. This is open 24 hours a day. Summary  Stress is a normal reaction to life events. It can cause problems if it happens too often or for too long.  Practicing stress management techniques is the best way to treat stress.  Counseling or talk therapy with a mental health professional may be helpful if you are having trouble managing stress on your own. This information is not intended to replace advice given to you by your health care provider. Make sure you discuss any questions you have with your health care provider. Document Revised: 05/04/2019 Document Reviewed: 11/24/2016 Elsevier Patient Education  [employer].    If you have lab work done today you will be contacted with your lab results within the next 2 weeks.  If you have not heard from [place] then please contact us. The fastest way to get your results is to register for My Chart.   IF you received an x-ray today,  you will receive an invoice from [facility]. Please contact [facility] at [phone] with questions or concerns regarding your invoice.   IF you received labwork today, you will receive an invoice from [place]. Please contact [facility] at [phone] with questions or concerns regarding your invoice.   Our billing staff will not be able to assist you with questions regarding bills from these companies.  You will be contacted with the lab results as soon as they are available. The fastest way to get your results is to activate your My Chart account. Instructions are located on the last page  of this paperwork. If you have not heard from [place] regarding the results in 2 weeks, please contact this office.

## 2020-08-21 NOTE — Progress Notes (Signed)
Subjective:  Patient ID: Howard Campbell, male    DOB: [birth year]  Age: 34 y.o. MRN: [id number]  CC:  Chief Complaint  Patient presents with  . Establish Care    TOC and patient is having pain in left flank area. Painful to the touch. This pain been going on since 07/2019 Patient also would like std check    HPI Howard Campbell presents for  New patient to establish care with acute issues as above.  Left flank pain: weightlifting last year- dull pain once or twice per week, then constant for past 11 months.  Less lifting recently, and worse pain over past month or so. Still dull, then sensitive to touch area.  No nausea/vomiting.  No diarrhea/constipation, no blood in stools.  No gross hematuria, but urinary frequency/urgency with small amounts past 6 months - off and on.  No hx of kidney stones.  No fever, no unexplained weight loss, night sweats.  Sometimes radiates to back - both sides when hurting worse. No treatments, or prior eval.  Has seen urologist in past for urinary pain? Unsure of exact diagnosis.   STI screen: Going through separation. concerned about exposure to STI. No dysuria. No penile discharge. Occasional groin pain.  Some stress with wife and separation. Decreased sleep.  current genital hsv outbreak started yesterday- uses acyclovir - usually once per year or less. Acyclovir refilled today.  Gonorrhea in 2016.  Depression screen [facility] 2/9 08/21/2020 12/15/2016  Decreased Interest 0 0  Down, Depressed, Hopeless 0 0  PHQ - 2 Score 0 0  Altered sleeping 0 -  Tired, decreased energy 0 -  Change in appetite 0 -  Feeling bad or failure about yourself  0 -  Trouble concentrating 0 -  Moving slowly or fidgety/restless 0 -  Suicidal thoughts 0 -  PHQ-9 Score 0 -      History Patient Active Problem List   Diagnosis Date Noted  . Gonorrhea 11/29/2014   Past Medical History:  Diagnosis Date  . Allergy    No past surgical history on file. No Known  Allergies Prior to Admission medications   Medication Sig Start Date End Date Taking? Authorizing Provider  acyclovir (ZOVIRAX) 400 MG tablet Take 1 tab by mouth 1-2 times per day. 08/21/20   Wendie Agreste, MD   Social History   Socioeconomic History  . Marital status: Legally Separated    Spouse name: Not on file  . Number of children: Not on file  . Years of education: Not on file  . Highest education level: Not on file  Occupational History  . Not on file  Tobacco Use  . Smoking status: Never Smoker  . Smokeless tobacco: Never Used  Substance and Sexual Activity  . Alcohol use: Yes    Comment: social  . Drug use: No  . Sexual activity: Yes    Comment: Wife, been married 3-4 yrs  Other Topics Concern  . Not on file  Social History Narrative  . Not on file   Social Determinants of Health   Financial Resource Strain:   . Difficulty of Paying Living Expenses: Not on file  Food Insecurity:   . Worried About Charity fundraiser in the Last Year: Not on file  . Ran Out of Food in the Last Year: Not on file  Transportation Needs:   . Lack of Transportation (Medical): Not on file  . Lack of Transportation (Non-Medical): Not on file  Physical Activity:   .  Days of Exercise per Week: Not on file  . Minutes of Exercise per Session: Not on file  Stress:   . Feeling of Stress : Not on file  Social Connections:   . Frequency of Communication with Friends and Family: Not on file  . Frequency of Social Gatherings with Friends and Family: Not on file  . Attends Religious Services: Not on file  . Active Member of Clubs or Organizations: Not on file  . Attends Archivist Meetings: Not on file  . Marital Status: Not on file  Intimate Partner Violence:   . Fear of Current or Ex-Partner: Not on file  . Emotionally Abused: Not on file  . Physically Abused: Not on file  . Sexually Abused: Not on file    Review of Systems Per hpi  Objective:   Vitals:   08/21/20  0933  BP: 132/81  Pulse: 85  Temp: (!) 97.5 F (36.4 C)  TempSrc: Temporal  SpO2: 99%  Weight: 171 lb 9.6 oz (77.8 kg)  Height: 6' (1.829 m)     Physical Exam Vitals reviewed.  Constitutional:      General: He is not in acute distress.    Appearance: He is well-developed.  HENT:     Head: Normocephalic and atraumatic.  Cardiovascular:     Rate and Rhythm: Normal rate.  Pulmonary:     Effort: Pulmonary effort is normal.  Abdominal:     General: There is no distension.     Tenderness: There is abdominal tenderness (llq, left flank. no rebound/guarding. ). There is no right CVA tenderness or left CVA tenderness.     Hernia: There is no hernia in the left inguinal area or right inguinal area.  Genitourinary:    Testes: Normal.        Right: Tenderness not present.        Left: Tenderness not present.     Epididymis:     Right: Normal. No tenderness.     Left: Normal. No tenderness.    Lymphadenopathy:     Lower Body: No right inguinal adenopathy. No left inguinal adenopathy.  Neurological:     Mental Status: He is alert and oriented to person, place, and time.     Results for orders placed or performed in visit on 08/21/20  POCT URINALYSIS DIP (CLINITEK)  Result Value Ref Range   Color, UA yellow yellow   Clarity, UA clear clear   Glucose, UA negative negative mg/dL   Bilirubin, UA negative negative   Ketones, POC UA trace (5) (A) negative mg/dL   Spec Grav, UA 1.025 1.010 - 1.025   Blood, UA negative negative   pH, UA 5.5 5.0 - 8.0   POC PROTEIN,UA negative negative, trace   Urobilinogen, UA 0.2 0.2 or 1.0 E.U./dL   Nitrite, UA Negative Negative   Leukocytes, UA Negative Negative  POCT Microscopic Urinalysis ([facility])  Result Value Ref Range   WBC,UR,HPF,POC None None WBC/hpf   RBC,UR,HPF,POC None None RBC/hpf   Bacteria None None, Too numerous to count   Mucus Absent Absent   Epithelial Cells, UR Per Microscopy Few (A) None, Too numerous to count cells/hpf       Assessment & Plan:  Howard Campbell is a 34 y.o. male . Flank pain, acute - Plan: POCT URINALYSIS DIP (CLINITEK), POCT Microscopic Urinalysis ([facility]), Urine cytology ancillary only, CANCELED: GC/Chlamydia probe amp ([place])not at [facility] Left flank pain - Plan: POCT Microscopic Urinalysis ([facility]), CANCELED: POCT urinalysis dipstick  LLQ abdominal pain - Plan: CBC  -Episodic symptoms past 11 months, possibly more frequent recently.  Afebrile.  Reassuring urinalysis, no blood.  Less likely nephrolithiasis.  Check CBC, then consider imaging.  RTC/ER precautions of acute worsening.  Tylenol/symptomatic care over-the-counter for now okay in case this is more abdominal wall/muscular.  HSV (herpes simplex virus) anogenital infection - Plan: acyclovir (ZOVIRAX) 400 MG tablet  -Current flare, dosing instructions given.  Screening examination for STD (sexually transmitted disease) - Plan: HIV Antibody (routine testing w rflx), RPR, CANCELED: GC/Chlamydia probe amp ([place])not at [facility]  Urinary frequency - Plan: PSA, POCT Microscopic Urinalysis ([facility]), CANCELED: POCT urinalysis dipstick Urinary urgency - Plan: PSA, POCT Microscopic Urinalysis ([facility]), Urine cytology ancillary only, CANCELED: POCT urinalysis dipstick  -In office urinalysis reassuring.  Check PSA.  Potentially may need follow-up with urology given longstanding symptoms.  Situational stress  -Handout given on stress and stress management, symptomatic care for insomnia discussed with RTC precautions.  Recheck 1 to 2 weeks  Meds ordered this encounter  Medications  . acyclovir (ZOVIRAX) 400 MG tablet    Sig: Take 1 tab by mouth 1-2 times per day.    Dispense:  90 tablet    Refill:  3   Patient Instructions   For genital herpes outbreak - 1 pill three times per day for 3 days (or 2 pills three times per day for 2 days).  See information below on stress.  Tylenol PM or melatonin can help with sleep.  Follow-up if that is not  improving and let me know if you need further resources.  In office urine test looked okay, including no blood.  I will check some other tests including blood count.  Tylenol as needed for soreness in case that could be related to muscle or abdominal wall.  Recheck in the next 1 to 2 weeks to decide on next step for your flank/abdominal pain.  If any acute worsening of symptoms prior to that time be seen here, urgent care or emergency room.   Flank Pain, Adult Flank pain is pain that is located on the side of the body between the upper abdomen and the back. This area is called the flank. The pain may occur over a short period of time (acute), or it may be long-term or recurring (chronic). It may be mild or severe. Flank pain can be caused by many things, including:  Muscle soreness or injury.  Kidney stones or kidney disease.  Stress.  A disease of the spine (vertebral disk disease).  A lung infection (pneumonia).  Fluid around the lungs (pulmonary edema).  A skin rash caused by the chickenpox virus (shingles).  Tumors that affect the back of the abdomen.  Gallbladder disease. Follow these instructions at home:   Drink enough fluid to keep your urine clear or pale yellow.  Rest as told by your health care provider.  Take over-the-counter and prescription medicines only as told by your health care provider.  Keep a journal to track what has caused your flank pain and what has made it feel better.  Keep all follow-up visits as told by your health care provider. This is important. Contact a health care provider if:  Your pain is not controlled with medicine.  You have new symptoms.  Your pain gets worse.  You have a fever.  Your symptoms last longer than 2-3 days.  You have trouble urinating or you are urinating very frequently. Get help right away if:  You have  trouble breathing or you are short of breath.  Your abdomen hurts or it is swollen or red.  You have  nausea or vomiting.  You feel faint or you pass out.  You have blood in your urine. Summary  Flank pain is pain that is located on the side of the body between the upper abdomen and the back.  The pain may occur over a short period of time (acute), or it may be long-term or recurring (chronic). It may be mild or severe.  Flank pain can be caused by many things.  Contact your health care provider if your symptoms get worse or they last longer than 2-3 days. This information is not intended to replace advice given to you by your health care provider. Make sure you discuss any questions you have with your health care provider. Document Revised: 09/16/2017 Document Reviewed: 12/17/2016 Elsevier Patient Education  2020 [place], Adult Stress is a normal reaction to life events. Stress is what you feel when life demands more than you are used to, or more than you think you can handle. Some stress can be useful, such as studying for a test or meeting a deadline at work. Stress that occurs too often or for too long can cause problems. It can affect your emotional health and interfere with relationships and normal daily activities. Too much stress can weaken your body's defense system (immune system) and increase your risk for physical illness. If you already have a medical problem, stress can make it worse. What are the causes? All sorts of life events can cause stress. An event that causes stress for one person may not be stressful for another person. Major life events, whether positive or negative, commonly cause stress. Examples include:  Losing a job or starting a new job.  Losing a loved one.  Moving to a new town or home.  Getting married or divorced.  Having a baby.  Getting injured or sick. Less obvious life events can also cause stress, especially if they occur day after day or in combination with each other. Examples include:  Working long hours.  Driving in  traffic.  Caring for children.  Being in debt.  Being in a difficult relationship. What are the signs or symptoms? Stress can cause emotional symptoms, including:  Anxiety. This is feeling worried, afraid, on edge, overwhelmed, or out of control.  Anger, including irritation or impatience.  Depression. This is feeling sad, down, helpless, or guilty.  Trouble focusing, remembering, or making decisions. Stress can cause physical symptoms, including:  Aches and pains. These may affect your head, neck, back, stomach, or other areas of your body.  Tight muscles or a clenched jaw.  Low energy.  Trouble sleeping. Stress can cause unhealthy behaviors, including:  Eating to feel better (overeating) or skipping meals.  Working too much or putting off tasks.  Smoking, drinking alcohol, or using drugs to feel better. How is this diagnosed? Stress is diagnosed through an assessment by your health care provider. He or she may diagnose this condition based on:  Your symptoms and any stressful life events.  Your medical history.  Tests to rule out other causes of your symptoms. Depending on your condition, your health care provider may refer you to a specialist for further evaluation. How is this treated?  Stress management techniques are the recommended treatment for stress. Medicine is not typically recommended for the treatment of stress. Techniques to reduce your reaction to stressful life events include:  Stress identification. Monitor yourself for symptoms of stress and identify what causes stress for you. These skills may help you to avoid or prepare for stressful events.  Time management. Set your priorities, keep a calendar of events, and learn to say no. Taking these actions can help you avoid making too many commitments. Techniques for coping with stress include:  Rethinking the problem. Try to think realistically about stressful events rather than ignoring them or  overreacting. Try to find the positives in a stressful situation rather than focusing on the negatives.  Exercise. Physical exercise can release both physical and emotional tension. The key is to find a form of exercise that you enjoy and do it regularly.  Relaxation techniques. These relax the body and mind. The key is to find one or more that you enjoy and use the techniques regularly. Examples include: ? Meditation, deep breathing, or progressive relaxation techniques. ? Yoga or tai chi. ? Biofeedback, mindfulness techniques, or journaling. ? Listening to music, being out in nature, or participating in other hobbies.  Practicing a healthy lifestyle. Eat a balanced diet, drink plenty of water, limit or avoid caffeine, and get plenty of sleep.  Having a strong support network. Spend time with family, friends, or other people you enjoy being around. Express your feelings and talk things over with someone you trust. Counseling or talk therapy with a mental health professional may be helpful if you are having trouble managing stress on your own. Follow these instructions at home: Lifestyle   Avoid drugs.  Do not use any products that contain nicotine or tobacco, such as cigarettes, e-cigarettes, and chewing tobacco. If you need help quitting, ask your health care provider.  Limit alcohol intake to no more than 1 drink a day for nonpregnant women and 2 drinks a day for men. One drink equals 12 oz of beer, 5 oz of wine, or 1 oz of hard liquor  Do not use alcohol or drugs to relax.  Eat a balanced diet that includes fresh fruits and vegetables, whole grains, lean meats, fish, eggs, and beans, and low-fat dairy. Avoid processed foods and foods high in added fat, sugar, and salt.  Exercise at least 30 minutes on 5 or more days each week.  Get 7-8 hours of sleep each night. General instructions   Practice stress management techniques as discussed with your health care provider.  Drink  enough fluid to keep your urine clear or pale yellow.  Take over-the-counter and prescription medicines only as told by your health care provider.  Keep all follow-up visits as told by your health care provider. This is important. Contact a health care provider if:  Your symptoms get worse.  You have new symptoms.  You feel overwhelmed by your problems and can no longer manage them on your own. Get help right away if:  You have thoughts of hurting yourself or others. If you ever feel like you may hurt yourself or others, or have thoughts about taking your own life, get help right away. You can go to your nearest emergency department or call:  Your local emergency services (911 in the [place]  A suicide crisis helpline, such as the [place] at (615)008-6680. This is open 24 hours a day. Summary  Stress is a normal reaction to life events. It can cause problems if it happens too often or for too long.  Practicing stress management techniques is the best way to treat stress.  Counseling or talk therapy with  a mental health professional may be helpful if you are having trouble managing stress on your own. This information is not intended to replace advice given to you by your health care provider. Make sure you discuss any questions you have with your health care provider. Document Revised: 05/04/2019 Document Reviewed: 11/24/2016 Elsevier Patient Education  [employer].    If you have lab work done today you will be contacted with your lab results within the next 2 weeks.  If you have not heard from [place] then please contact us. The fastest way to get your results is to register for My Chart.   IF you received an x-ray today, you will receive an invoice from [facility]. Please contact [facility] at [phone] with questions or concerns regarding your invoice.   IF you received labwork today, you will receive an invoice from  [facility]. Please contact [facility] at [phone] with questions or concerns regarding your invoice.   Our billing staff will not be able to assist you with questions regarding bills from these companies.  You will be contacted with the lab results as soon as they are available. The fastest way to get your results is to activate your My Chart account. Instructions are located on the last page of this paperwork. If you have not heard from [place] regarding the results in 2 weeks, please contact this office.          Signed, Merri Ray, MD [facility]

## 2020-08-22 LAB — CBC
Hematocrit: 48.1 % (ref 37.5–51.0)
Hemoglobin: 15.7 g/dL (ref 13.0–17.7)
MCH: 27.4 pg (ref 26.6–33.0)
MCHC: 32.6 g/dL (ref 31.5–35.7)
MCV: 84 fL (ref 79–97)
Platelets: 253 10*3/uL (ref 150–450)
RBC: 5.74 x10E6/uL (ref 4.14–5.80)
RDW: 13.4 % (ref 11.6–15.4)
WBC: 4.1 10*3/uL (ref 3.4–10.8)

## 2020-08-22 LAB — URINE CYTOLOGY ANCILLARY ONLY
Chlamydia: NEGATIVE
Comment: NEGATIVE
Comment: NORMAL
Neisseria Gonorrhea: NEGATIVE

## 2020-08-22 LAB — HIV ANTIBODY (ROUTINE TESTING W REFLEX): HIV Screen 4th Generation wRfx: NONREACTIVE

## 2020-08-22 LAB — RPR: RPR Ser Ql: NONREACTIVE

## 2020-09-04 ENCOUNTER — Ambulatory Visit: Payer: 59 | Admitting: Family Medicine

## 2021-01-03 ENCOUNTER — Other Ambulatory Visit: Payer: Self-pay | Admitting: Family Medicine

## 2021-01-03 DIAGNOSIS — A609 Anogenital herpesviral infection, unspecified: Secondary | ICD-10-CM

## 2021-07-03 ENCOUNTER — Other Ambulatory Visit: Payer: Self-pay

## 2021-07-03 ENCOUNTER — Encounter (HOSPITAL_COMMUNITY): Payer: Self-pay | Admitting: Emergency Medicine

## 2021-07-03 ENCOUNTER — Ambulatory Visit (HOSPITAL_COMMUNITY)
Admission: EM | Admit: 2021-07-03 | Discharge: 2021-07-03 | Disposition: A | Discharge status: Home / Self Care | Payer: 59 | Attending: Emergency Medicine | Admitting: Emergency Medicine

## 2021-07-03 ENCOUNTER — Other Ambulatory Visit: Payer: Self-pay | Admitting: Family Medicine

## 2021-07-03 DIAGNOSIS — Z113 Encounter for screening for infections with a predominantly sexual mode of transmission: Secondary | ICD-10-CM | POA: Insufficient documentation

## 2021-07-03 DIAGNOSIS — R109 Unspecified abdominal pain: Secondary | ICD-10-CM | POA: Diagnosis not present

## 2021-07-03 DIAGNOSIS — Z7289 Other problems related to lifestyle: Secondary | ICD-10-CM | POA: Diagnosis not present

## 2021-07-03 DIAGNOSIS — R350 Frequency of micturition: Secondary | ICD-10-CM | POA: Insufficient documentation

## 2021-07-03 DIAGNOSIS — A609 Anogenital herpesviral infection, unspecified: Secondary | ICD-10-CM

## 2021-07-03 LAB — POCT URINALYSIS DIPSTICK, ED / UC
Bilirubin Urine: NEGATIVE
Glucose, UA: NEGATIVE mg/dL
Hgb urine dipstick: NEGATIVE
Ketones, ur: NEGATIVE mg/dL
Leukocytes,Ua: NEGATIVE
Nitrite: NEGATIVE
Protein, ur: NEGATIVE mg/dL
Specific Gravity, Urine: 1.025 (ref 1.005–1.030)
Urobilinogen, UA: 1 mg/dL (ref 0.0–1.0)
pH: 6.5 (ref 5.0–8.0)

## 2021-07-03 NOTE — ED Provider Notes (Signed)
Patient presenting to urgent care complaining of a week of urinary frequency and a little bit of abdominal pain.  Patient states his sexual partner had a bacterial infection and he would like to be checked out.  Patient denies burning with urination, penile discharge.  Patient had to leave before he could be evaluated by provider due to having to pick up his child from daycare.  Urine culture and STD screening was ordered, samples were provided by patient before he left.  Patient advised he will be contacted with results of his testing and treated as needed.   Theadora Rama Scales, PA-C 07/03/21 1706

## 2021-07-03 NOTE — ED Notes (Signed)
Pt needing to leave to go pick daughter up from school. Lillia Abed PA made aware. Informed pt that would be getting a call if any abnormal results from his test done today. Pt verbalized understanding.

## 2021-07-03 NOTE — ED Triage Notes (Signed)
Pt repots for a week had urinary frequency and little abd pain. Had sexual partner that had bacterial infection and wants to be checked. Denies dysuria or blood, discharge or drainage.

## 2021-07-04 LAB — URINE CULTURE: Culture: <10000 — AB

## 2021-07-06 LAB — CYTOLOGY, (ORAL, ANAL, URETHRAL) ANCILLARY ONLY
Chlamydia: POSITIVE — AB
Comment: NEGATIVE
Comment: NEGATIVE
Comment: NORMAL
Neisseria Gonorrhea: NEGATIVE
Trichomonas: NEGATIVE

## 2021-07-07 ENCOUNTER — Telehealth (HOSPITAL_COMMUNITY): Payer: Self-pay | Admitting: Emergency Medicine

## 2021-07-07 MED ORDER — DOXYCYCLINE HYCLATE 100 MG PO CAPS: 100.0000 mg | ORAL_CAPSULE | Freq: Two times a day (BID) | ORAL | 0 refills | Status: AC

## 2021-07-26 ENCOUNTER — Encounter (HOSPITAL_COMMUNITY): Payer: Self-pay | Admitting: *Deleted

## 2021-07-26 ENCOUNTER — Other Ambulatory Visit: Payer: Self-pay

## 2021-07-26 ENCOUNTER — Ambulatory Visit (HOSPITAL_COMMUNITY)
Admission: EM | Admit: 2021-07-26 | Discharge: 2021-07-26 | Disposition: A | Discharge status: Home / Self Care | Payer: 59 | Attending: Physician Assistant | Admitting: Physician Assistant

## 2021-07-26 DIAGNOSIS — R300 Dysuria: Secondary | ICD-10-CM

## 2021-07-26 DIAGNOSIS — Z113 Encounter for screening for infections with a predominantly sexual mode of transmission: Secondary | ICD-10-CM | POA: Diagnosis not present

## 2021-07-26 DIAGNOSIS — Z202 Contact with and (suspected) exposure to infections with a predominantly sexual mode of transmission: Secondary | ICD-10-CM

## 2021-07-26 LAB — POCT URINALYSIS DIPSTICK, ED / UC
Bilirubin Urine: NEGATIVE
Glucose, UA: NEGATIVE mg/dL
Hgb urine dipstick: NEGATIVE
Ketones, ur: NEGATIVE mg/dL
Leukocytes,Ua: NEGATIVE
Nitrite: NEGATIVE
Protein, ur: NEGATIVE mg/dL
Specific Gravity, Urine: 1.025 (ref 1.005–1.030)
Urobilinogen, UA: 0.2 mg/dL (ref 0.0–1.0)
pH: 6 (ref 5.0–8.0)

## 2021-07-26 MED ORDER — DOXYCYCLINE HYCLATE 100 MG PO CAPS: 100.0000 mg | ORAL_CAPSULE | Freq: Two times a day (BID) | ORAL | 0 refills | Status: AC

## 2021-07-26 NOTE — Discharge Instructions (Addendum)
We are treating you for chlamydia given your likely exposure.  Please take doxycycline 100 mg twice daily for 7 days.  We will contact you if we need to arrange additional treatment based on your lab results.  It is important that you abstain from sex until you receive your results.  It is also important that all partners be tested and treated as well as you can get this again.  Make sure to use a condom with each sexual encounter.  If you have persistent or worsening symptoms please return for reevaluation.

## 2021-07-26 NOTE — ED Provider Notes (Signed)
[facility]    CSN: [id number] Arrival date & time: 07/26/21  1012      History   Chief Complaint Chief Complaint  Patient presents with   SEXUALLY TRANSMITTED DISEASE    HPI Howard Campbell is a 35 y.o. male.   Patient presents today with a 1 day history of dysuria and lower abdominal pain.  Reports that he has previously been tested and treated for chlamydia and had similar symptoms.  He was last tested 07/03/2021 and reports completing treatment of doxycycline with resolution of symptoms until recurrence yesterday.  He reports all partners have been tested and treated with the exception of 1 from home he received oral sex.  Believes this is with the individual who has exposed him to chlamydia and so is interested in retreatment as well as testing today.  He denies any additional antibiotic use.  Denies any severe abdominal pain, fever, nausea, vomiting, diarrhea, penile discharge, scrotal pain.  He is sexually active with male partners and try to consistently use condoms.   Past Medical History:  Diagnosis Date   Allergy     Patient Active Problem List   Diagnosis Date Noted   Gonorrhea 11/29/2014    History reviewed. No pertinent surgical history.     Home Medications    Prior to Admission medications   Medication Sig Start Date End Date Taking? Authorizing Provider  doxycycline (VIBRAMYCIN) 100 MG capsule Take 1 capsule (100 mg total) by mouth 2 (two) times daily for 7 days. 07/26/21 08/02/21 Yes Wray Goehring, Noberto Retort, PA-C  acyclovir (ZOVIRAX) 400 MG tablet Take 1 tab by mouth 1-2 times per day. 08/21/20   Shade Flood, MD    Family History History reviewed. No pertinent family history.  Social History Social History   Tobacco Use   Smoking status: Never   Smokeless tobacco: Never  Substance Use Topics   Alcohol use: Yes    Comment: social   Drug use: No     Allergies   Patient has no known allergies.   Review of Systems Review of Systems   Constitutional:  Negative for activity change, appetite change, fatigue and fever.  Respiratory:  Negative for cough and shortness of breath.   Cardiovascular:  Negative for chest pain.  Gastrointestinal:  Positive for abdominal pain. Negative for diarrhea, nausea and vomiting.  Genitourinary:  Positive for dysuria. Negative for frequency, penile discharge, penile pain and urgency.  Musculoskeletal:  Negative for arthralgias, back pain and myalgias.  Neurological:  Negative for dizziness, light-headedness and headaches.    Physical Exam Triage Vital Signs ED Triage Vitals  Enc Vitals Group     BP 07/26/21 1049 111/78     Pulse Rate 07/26/21 1049 60     Resp 07/26/21 1049 18     Temp 07/26/21 1049 98.7 F (37.1 C)     Temp src --      SpO2 07/26/21 1049 94 %     Weight --      Height --      Head Circumference --      Peak Flow --      Pain Score 07/26/21 1047 0     Pain Loc --      Pain Edu? --      Excl. in GC? --    No data found.  Updated Vital Signs BP 111/78   Pulse 60   Temp 98.7 F (37.1 C)   Resp 18   SpO2 94%  Visual Acuity Right Eye Distance:   Left Eye Distance:   Bilateral Distance:    Right Eye Near:   Left Eye Near:    Bilateral Near:     Physical Exam Vitals reviewed.  Constitutional:      General: He is awake.     Appearance: Normal appearance. He is well-developed. He is not ill-appearing.     Comments: Very pleasant male appears stated age no acute distress sitting comfortably in exam room  HENT:     Head: Normocephalic and atraumatic.     Mouth/Throat:     Pharynx: Uvula midline. No oropharyngeal exudate or posterior oropharyngeal erythema.  Cardiovascular:     Rate and Rhythm: Normal rate and regular rhythm.     Heart sounds: Normal heart sounds, S1 normal and S2 normal. No murmur heard. Pulmonary:     Effort: Pulmonary effort is normal.     Breath sounds: Normal breath sounds. No stridor. No wheezing, rhonchi or rales.      Comments: Clear to auscultation bilaterally Abdominal:     General: Bowel sounds are normal.     Palpations: Abdomen is soft.     Tenderness: There is no abdominal tenderness. There is no right CVA tenderness, left CVA tenderness, guarding or rebound.     Comments: Benign abdominal exam  Neurological:     Mental Status: He is alert.  Psychiatric:        Behavior: Behavior is cooperative.     UC Treatments / Results  Labs (all labs ordered are listed, but only abnormal results are displayed) Labs Reviewed  POCT URINALYSIS DIPSTICK, ED / UC  CYTOLOGY, (ORAL, ANAL, URETHRAL) ANCILLARY ONLY    EKG   Radiology No results found.  Procedures Procedures (including critical care time)  Medications Ordered in UC Medications - No data to display  Initial Impression / Assessment and Plan / UC Course  I have reviewed the triage vital signs and the nursing notes.  Pertinent labs & imaging results that were available during my care of the patient were reviewed by me and considered in my medical decision making (see chart for details).      Vital signs and physical exam reassuring today; no indication for emergent evaluation or imaging.  UA was normal in clinic.  STI swab was collected-results pending.  Patient was empirically treated for chlamydia given clinical presentation and possible exposure.  Discussed that if he has any additional positive results on STI swab we will contact him to arrange treatment.  Discussed that he should inform sex until he receives results.  On partners going to be tested and treated.  Discussed the importance of safe sex practices.  Discussed alarm symptoms that warrant emergent evaluation.  Strict return precautions given to which he expressed understanding.  Final Clinical Impressions(s) / UC Diagnoses   Final diagnoses:  Dysuria  Chlamydia contact  Routine screening for STI (sexually transmitted infection)     Discharge Instructions      We  are treating you for chlamydia given your likely exposure.  Please take doxycycline 100 mg twice daily for 7 days.  We will contact you if we need to arrange additional treatment based on your lab results.  It is important that you abstain from sex until you receive your results.  It is also important that all partners be tested and treated as well as you can get this again.  Make sure to use a condom with each sexual encounter.  If you have  persistent or worsening symptoms please return for reevaluation.     ED Prescriptions     Medication Sig Dispense Auth. Provider   doxycycline (VIBRAMYCIN) 100 MG capsule Take 1 capsule (100 mg total) by mouth 2 (two) times daily for 7 days. 14 capsule Jaedyn Lard K, PA-C      PDMP not reviewed this encounter.   Jeani Hawking, PA-C 07/26/21 1132

## 2021-07-26 NOTE — ED Triage Notes (Signed)
Pt seen one month ago for STD and still has Sx's the patient has ABD pain,dysuria.

## 2021-07-27 LAB — CYTOLOGY, (ORAL, ANAL, URETHRAL) ANCILLARY ONLY
Chlamydia: NEGATIVE
Comment: NEGATIVE
Comment: NEGATIVE
Comment: NORMAL
Neisseria Gonorrhea: NEGATIVE
Trichomonas: NEGATIVE

## 2021-10-02 ENCOUNTER — Encounter: Payer: Self-pay | Admitting: Family Medicine

## 2021-10-02 ENCOUNTER — Ambulatory Visit: Payer: 59 | Admitting: Family Medicine

## 2021-10-02 ENCOUNTER — Other Ambulatory Visit: Payer: Self-pay

## 2021-10-02 VITALS — BP 108/70 | HR 70 | Temp 97.9°F | Resp 16 | Ht 72.0 in | Wt 177.0 lb

## 2021-10-02 DIAGNOSIS — R350 Frequency of micturition: Secondary | ICD-10-CM

## 2021-10-02 DIAGNOSIS — Z113 Encounter for screening for infections with a predominantly sexual mode of transmission: Secondary | ICD-10-CM

## 2021-10-02 DIAGNOSIS — A609 Anogenital herpesviral infection, unspecified: Secondary | ICD-10-CM | POA: Diagnosis not present

## 2021-10-02 MED ORDER — ACYCLOVIR 400 MG PO TABS: ORAL_TABLET | ORAL | 3 refills | Status: DC

## 2021-10-02 NOTE — Progress Notes (Signed)
° °  New Patient Office Visit  Subjective:  Patient ID: Howard Campbell, male    DOB: 11-11-1985  Age: 35 y.o. MRN: [id number]  CC:  Chief Complaint  Patient presents with   Establish Care   Exposure to STD    HPI Howard Campbell presents for to establish care and for complaint of urinary frequency. Patient reports that he is having relationship issues and is unsure of possible STD exposure.   Past Medical History:  Diagnosis Date   Allergy     History reviewed. No pertinent surgical history.  History reviewed. No pertinent family history.  Social History   Socioeconomic History   Marital status: Legally Separated    Spouse name: Not on file   Number of children: Not on file   Years of education: Not on file   Highest education level: Not on file  Occupational History   Not on file  Tobacco Use   Smoking status: Never   Smokeless tobacco: Never  Substance and Sexual Activity   Alcohol use: Yes    Comment: social   Drug use: No   Sexual activity: Yes    Comment: Wife, been married 3-4 yrs  Other Topics Concern   Not on file  Social History Narrative   Not on file   Social Determinants of Health   Financial Resource Strain: Not on file  Food Insecurity: Not on file  Transportation Needs: Not on file  Physical Activity: Not on file  Stress: Not on file  Social Connections: Not on file  Intimate Partner Violence: Not on file    ROS Review of Systems  Genitourinary:  Positive for frequency. Negative for dysuria, penile discharge and penile pain.  All other systems reviewed and are negative.  Objective:   Today's Vitals: BP 108/70    Pulse 70    Temp 97.9 F (36.6 C) (Oral)    Resp 16    Ht 6' (1.829 m)    Wt 177 lb (80.3 kg)    SpO2 97%    BMI 24.01 kg/m   Physical Exam Vitals and nursing note reviewed.  Constitutional:      General: He is not in acute distress. Cardiovascular:     Rate and Rhythm: Normal rate and regular rhythm.  Pulmonary:      Effort: Pulmonary effort is normal.     Breath sounds: Normal breath sounds.  Neurological:     General: No focal deficit present.     Mental Status: He is alert and oriented to person, place, and time.    Assessment & Plan:  1. Screening for STDs (sexually transmitted diseases) Cultures. Pending.   2. HSV (herpes simplex virus) anogenital infection No recent outbreak. Meds refilled.  - acyclovir (ZOVIRAX) 400 MG tablet; Take 1 tab by mouth 1-2 times per day.  Dispense: 90 tablet; Refill: 3  3. Urinary frequency Unremarkable U/A, encouraged adequate fluids.   Outpatient Encounter Medications as of 10/02/2021  Medication Sig   [DISCONTINUED] acyclovir (ZOVIRAX) 400 MG tablet Take 1 tab by mouth 1-2 times per day.   acyclovir (ZOVIRAX) 400 MG tablet Take 1 tab by mouth 1-2 times per day.   No facility-administered encounter medications on file as of 10/02/2021.    Follow-up: Return in about 5 days (around 10/07/2021) for physical.   Tommie Raymond, MD

## 2021-10-02 NOTE — Progress Notes (Signed)
Patient is here to est care. Patient would like to be tested for STI. Patient said that he is urinating a lot

## 2021-10-07 ENCOUNTER — Encounter: Payer: 59 | Admitting: Family Medicine

## 2021-11-05 ENCOUNTER — Ambulatory Visit: Payer: 59 | Admitting: Family Medicine

## 2021-11-25 ENCOUNTER — Emergency Department (HOSPITAL_COMMUNITY)
Admission: EM | Admit: 2021-11-25 | Discharge: 2021-11-26 | Discharge status: Against Medical Advice | Payer: 59 | Attending: Emergency Medicine | Admitting: Emergency Medicine

## 2021-11-25 ENCOUNTER — Other Ambulatory Visit: Payer: Self-pay

## 2021-11-25 ENCOUNTER — Encounter (HOSPITAL_COMMUNITY): Payer: Self-pay | Admitting: *Deleted

## 2021-11-25 DIAGNOSIS — Z23 Encounter for immunization: Secondary | ICD-10-CM | POA: Diagnosis not present

## 2021-11-25 DIAGNOSIS — S6392XA Sprain of unspecified part of left wrist and hand, initial encounter: Secondary | ICD-10-CM | POA: Diagnosis not present

## 2021-11-25 DIAGNOSIS — Z5329 Procedure and treatment not carried out because of patient's decision for other reasons: Secondary | ICD-10-CM | POA: Insufficient documentation

## 2021-11-25 DIAGNOSIS — S8991XA Unspecified injury of right lower leg, initial encounter: Secondary | ICD-10-CM | POA: Diagnosis present

## 2021-11-25 DIAGNOSIS — S93402A Sprain of unspecified ligament of left ankle, initial encounter: Secondary | ICD-10-CM | POA: Insufficient documentation

## 2021-11-25 DIAGNOSIS — Y92410 Unspecified street and highway as the place of occurrence of the external cause: Secondary | ICD-10-CM | POA: Insufficient documentation

## 2021-11-25 DIAGNOSIS — S81821A Laceration with foreign body, right lower leg, initial encounter: Secondary | ICD-10-CM | POA: Insufficient documentation

## 2021-11-25 MED ORDER — IBUPROFEN 400 MG PO TABS
400.0000 mg | ORAL_TABLET | Freq: Once | ORAL | Status: AC | PRN
  Administered 2021-11-25: 400 mg via ORAL
  Filled 2021-11-25: qty 1
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
## 2021-11-25 NOTE — ED Notes (Signed)
Pt has left the lobby. Pt moved OTF
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
## 2021-11-26 ENCOUNTER — Ambulatory Visit
Admission: EM | Admit: 2021-11-26 | Discharge: 2021-11-26 | Disposition: A | Discharge status: Home / Self Care | Payer: 59

## 2021-11-26 ENCOUNTER — Emergency Department (HOSPITAL_BASED_OUTPATIENT_CLINIC_OR_DEPARTMENT_OTHER): Payer: 59

## 2021-11-26 ENCOUNTER — Encounter (HOSPITAL_BASED_OUTPATIENT_CLINIC_OR_DEPARTMENT_OTHER): Payer: Self-pay

## 2021-11-26 ENCOUNTER — Emergency Department (HOSPITAL_BASED_OUTPATIENT_CLINIC_OR_DEPARTMENT_OTHER)
Admission: EM | Admit: 2021-11-26 | Discharge: 2021-11-26 | Disposition: A | Discharge status: Home / Self Care | Payer: 59 | Source: Home / Self Care | Attending: Emergency Medicine | Admitting: Emergency Medicine

## 2021-11-26 ENCOUNTER — Other Ambulatory Visit: Payer: Self-pay

## 2021-11-26 DIAGNOSIS — S93402A Sprain of unspecified ligament of left ankle, initial encounter: Secondary | ICD-10-CM | POA: Insufficient documentation

## 2021-11-26 DIAGNOSIS — S81811A Laceration without foreign body, right lower leg, initial encounter: Secondary | ICD-10-CM | POA: Insufficient documentation

## 2021-11-26 DIAGNOSIS — Z23 Encounter for immunization: Secondary | ICD-10-CM | POA: Insufficient documentation

## 2021-11-26 DIAGNOSIS — S63502A Unspecified sprain of left wrist, initial encounter: Secondary | ICD-10-CM

## 2021-11-26 DIAGNOSIS — S6392XA Sprain of unspecified part of left wrist and hand, initial encounter: Secondary | ICD-10-CM | POA: Insufficient documentation

## 2021-11-26 HISTORY — DX: Other allergic rhinitis: J30.89

## 2021-11-26 MED ORDER — TETANUS-DIPHTH-ACELL PERTUSSIS 5-2.5-18.5 LF-MCG/0.5 IM SUSY
0.5000 mL | PREFILLED_SYRINGE | Freq: Once | INTRAMUSCULAR | Status: AC
  Administered 2021-11-26: 0.5 mL via INTRAMUSCULAR
  Filled 2021-11-26: qty 0.5

## 2021-11-26 MED ORDER — LIDOCAINE-EPINEPHRINE (PF) 2 %-1:200000 IJ SOLN
10.0000 mL | Freq: Once | INTRAMUSCULAR | Status: AC
  Administered 2021-11-26: 10 mL
  Filled 2021-11-26: qty 20
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
## 2021-11-26 NOTE — Discharge Instructions (Addendum)
Keep the wound dry for the next 48 hours.  Staple removal in 10 days. X-ray of the left wrist without any bony abnormalities.  X-ray of the left ankle without any bony abnormalities.  Also he can follow-up with sports medicine referral information provided if he need that for the sprained left wrist or the sprained left ankle.  I would recommend taking Motrin for these injuries.  Work note provided if needed
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
## 2021-12-09 ENCOUNTER — Ambulatory Visit (INDEPENDENT_AMBULATORY_CARE_PROVIDER_SITE_OTHER): Payer: 59 | Admitting: Family Medicine

## 2021-12-09 ENCOUNTER — Encounter: Payer: Self-pay | Admitting: Family Medicine

## 2021-12-09 VITALS — BP 120/68 | Ht 72.0 in | Wt 180.0 lb

## 2021-12-09 DIAGNOSIS — S81811A Laceration without foreign body, right lower leg, initial encounter: Secondary | ICD-10-CM
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
## 2022-10-10 IMAGING — DX DG TIBIA/FIBULA 2V*R*
4 series · 4 of 4 positions shown · non-contrast
Comparison: None.

CLINICAL DATA: Right lower extremity injury, trauma

EXAM:
RIGHT TIBIA AND FIBULA - 2 VIEW

[tibia ap (1 of 2)]
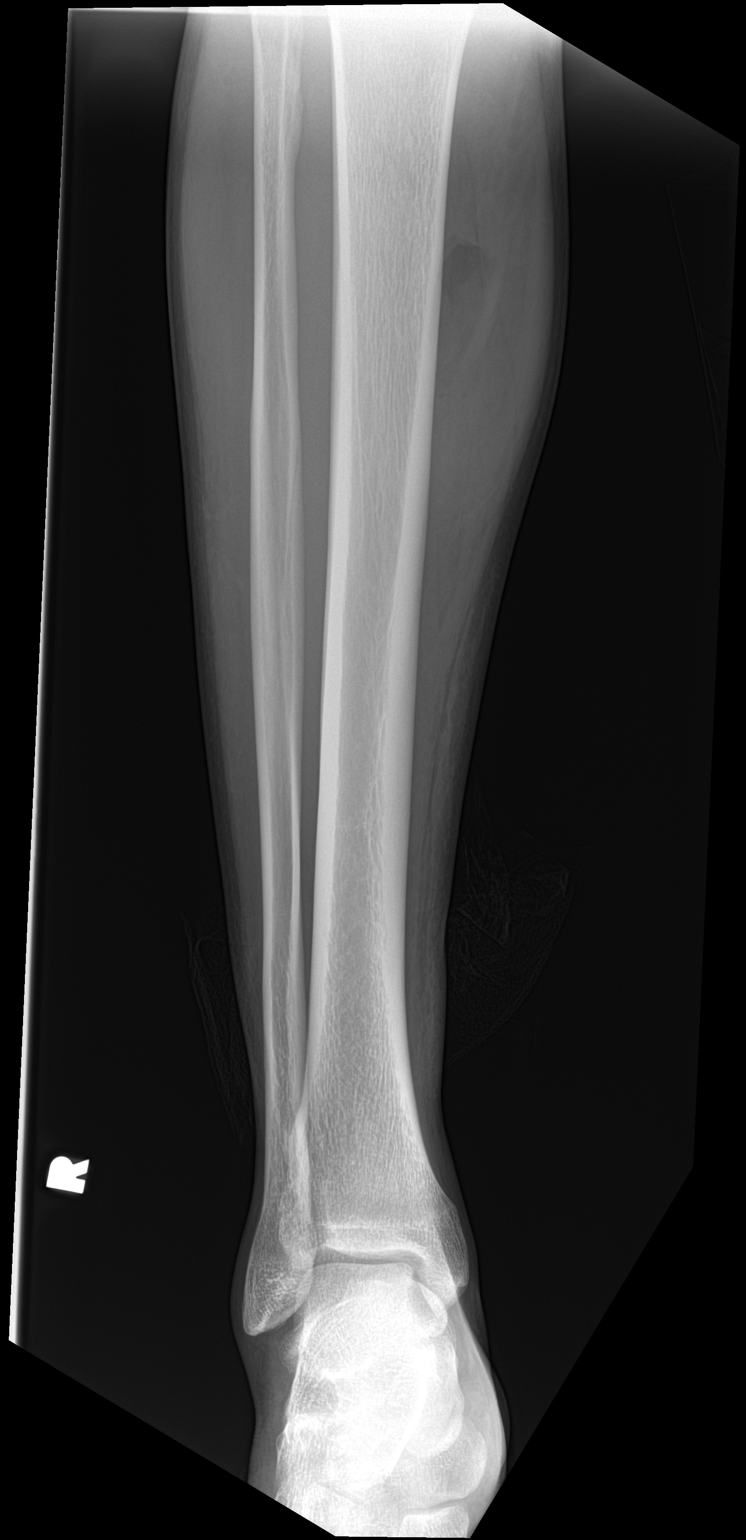

[tibia ap (2 of 2)]
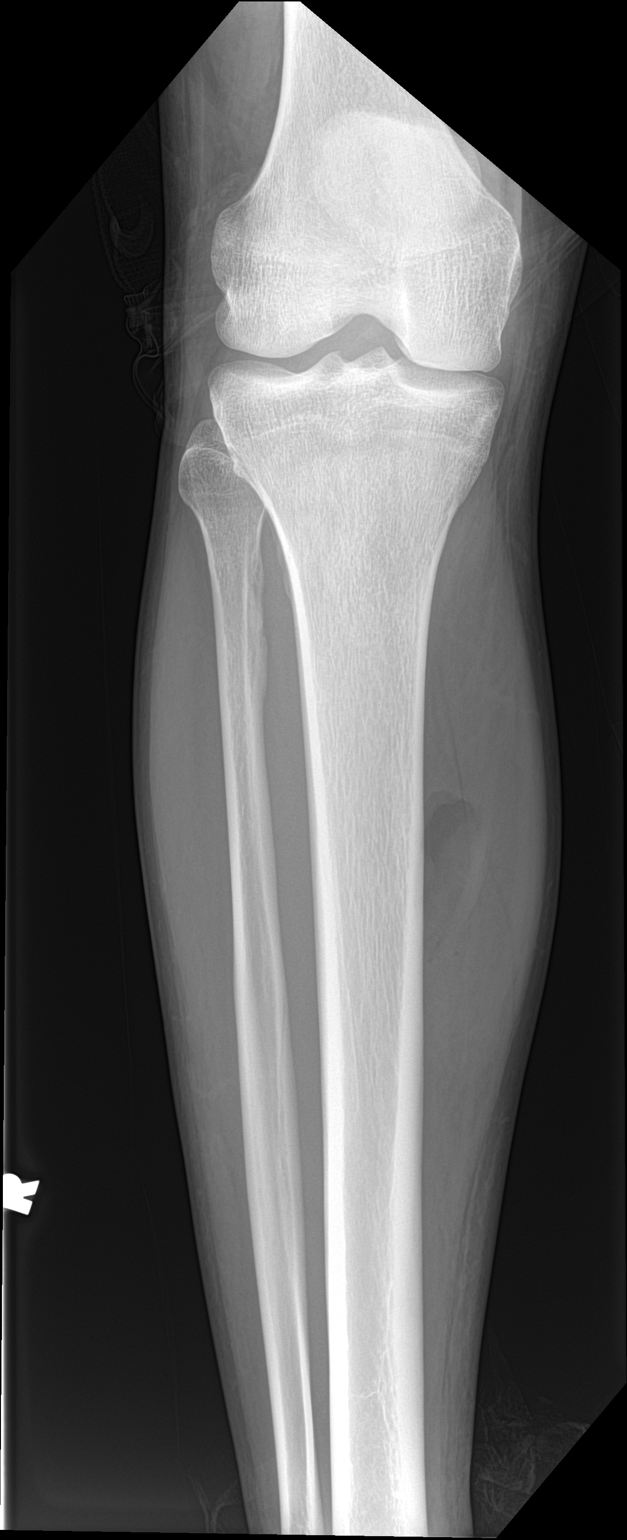

[tibia lat (1 of 2)]
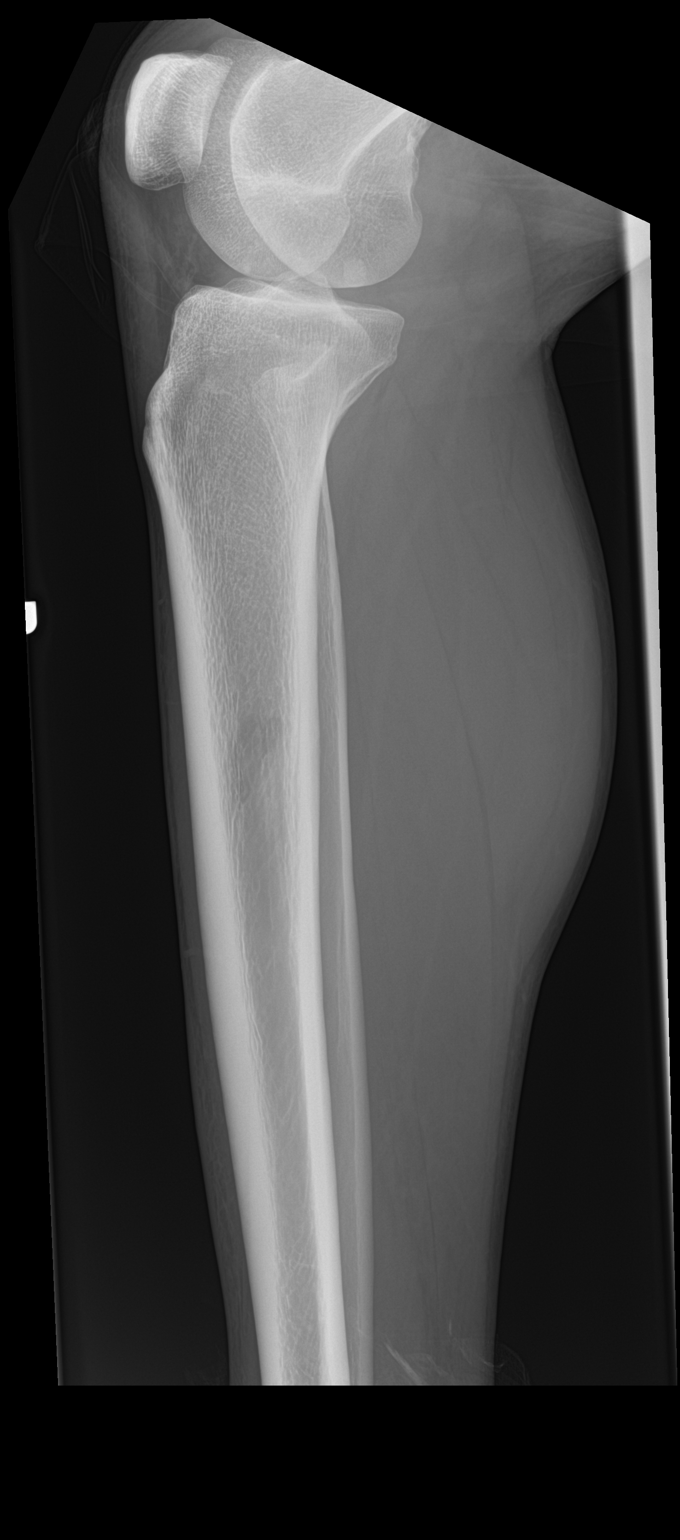

[tibia lat (2 of 2)]
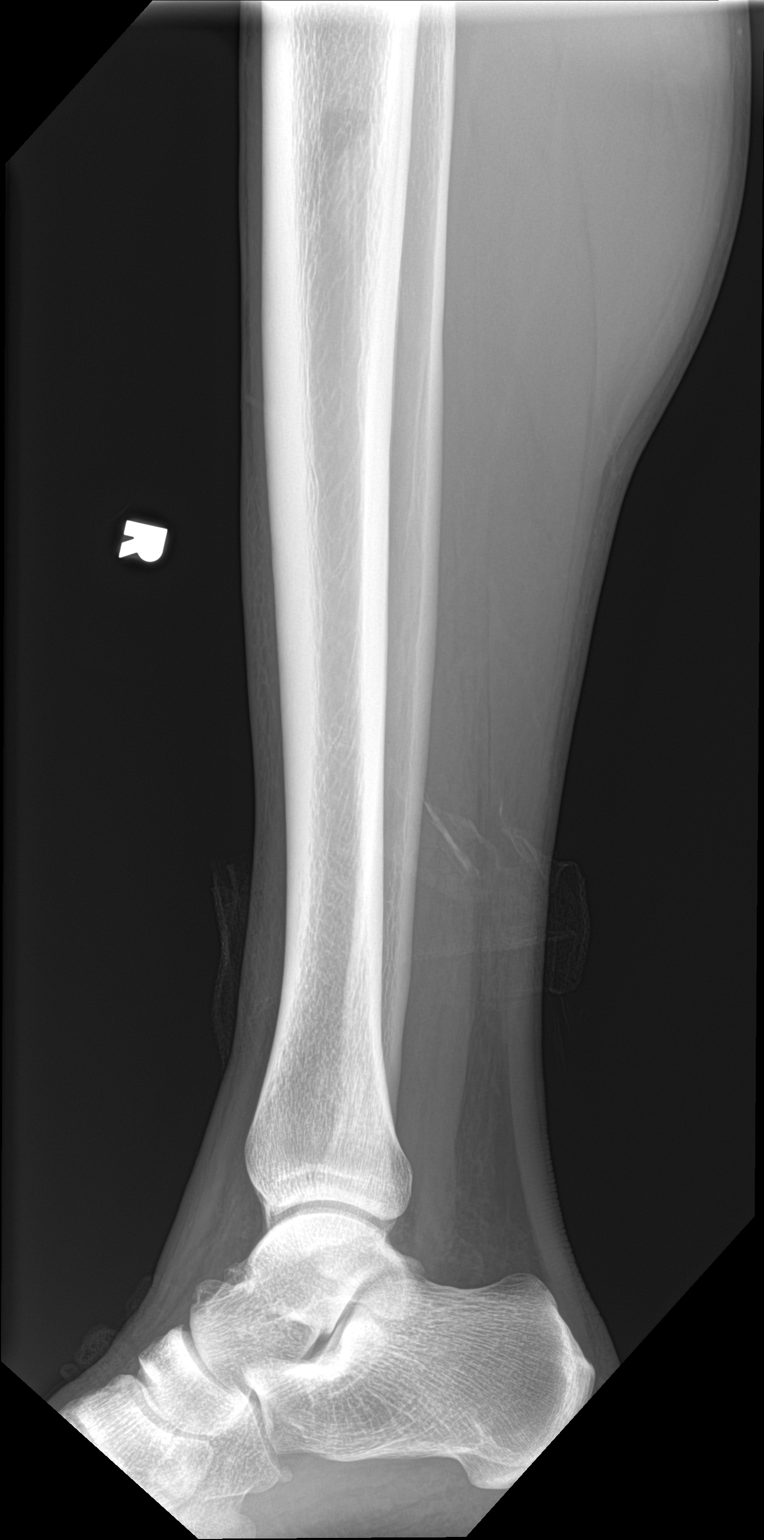

[4 of 4 positions shown; findings below may reference images not displayed]

FINDINGS: There is no evidence of acute fracture. There is soft tissue lucency
of the medial soft tissues compatible with injury.
IMPRESSION: Soft tissue injury along the anteromedial right lower leg. No acute
right tibia or fibula fracture.

## 2023-01-31 ENCOUNTER — Encounter: Payer: Self-pay | Admitting: *Deleted

## 2023-07-01 ENCOUNTER — Other Ambulatory Visit: Payer: Self-pay | Admitting: Family Medicine

## 2023-07-01 DIAGNOSIS — A609 Anogenital herpesviral infection, unspecified: Secondary | ICD-10-CM

## 2023-07-01 NOTE — Telephone Encounter (Signed)
Medication Refill - Medication: acyclovir (ZOVIRAX) 400 MG tablet.   Has the patient contacted their pharmacy? No.   Preferred Pharmacy (with phone number or street name):   [id number] [place], [place] - [address]  Phone: [phone] Fax: [phone]  Has the patient been seen for an appointment in the last year OR does the patient have an upcoming appointment? No.  Agent: Please be advised that RX refills may take up to 3 business days. We ask that you follow-up with your pharmacy.

## 2023-07-04 NOTE — Telephone Encounter (Signed)
Requested medication (s) are due for refill today: yes  Requested medication (s) are on the active medication list: yes  Last refill:  12/03/20  Future visit scheduled: no  Notes to clinic:  Unable to refill per protocol, appointment needed.      Requested Prescriptions  Pending Prescriptions Disp Refills   acyclovir (ZOVIRAX) 400 MG tablet 90 tablet 3    Sig: Take 1 tab by mouth 1-2 times per day.     Antimicrobials:  Antiviral Agents - Anti-Herpetic Failed - 07/01/2023  2:52 PM      Failed - Valid encounter within last 12 months    Recent Outpatient Visits           1 year ago Screening for STDs (sexually transmitted diseases)   [facility] [facility], MD   2 years ago Flank pain, acute   [facility] at [facility], Asencion Partridge, MD   6 years ago STD exposure   [facility] at [facility], Godfrey Pick, NP   6 years ago Sensation of feeling cold   Primary Care at [facility], Madelaine Bhat, PA-C   8 years ago Gonorrhea   Primary Care at [facility], Marolyn Hammock, [place]

## 2023-10-20 ENCOUNTER — Other Ambulatory Visit: Payer: Self-pay | Admitting: Family Medicine

## 2023-10-20 DIAGNOSIS — A609 Anogenital herpesviral infection, unspecified: Secondary | ICD-10-CM

## 2023-10-20 NOTE — Telephone Encounter (Signed)
 Medication Refill -  Most Recent Primary Care Visit:  Provider: TANDA BLEACHER  Department: [facility]  Visit Type: NEW PATIENT  Date: 10/02/2021  Medication: acyclovir  (ZOVIRAX ) 400 MG tablet   Has the patient contacted their pharmacy? Yes   Is this the correct pharmacy for this prescription? Yes If no, delete pharmacy and type the correct one.  This is the patient's preferred pharmacy:  [phone] [place], [place] - [address] [place] [address] Phone: [phone] Fax: [phone]    Has the prescription been filled recently? No  Is the patient out of the medication? Yes  Has the patient been seen for an appointment in the last year OR does the patient have an upcoming appointment? No.  Can we respond through MyChart? No  Agent: Please be advised that Rx refills may take up to 3 business days. We ask that you follow-up with your pharmacy.

## 2023-10-21 ENCOUNTER — Ambulatory Visit: Payer: Self-pay | Admitting: *Deleted

## 2023-10-21 DIAGNOSIS — A609 Anogenital herpesviral infection, unspecified: Secondary | ICD-10-CM

## 2023-10-21 NOTE — Telephone Encounter (Signed)
 Spoke with patient and informed that he would need to schedule an OV since last OV was 2 years ago. Patient informed he could go to an UC. He stated it was "all good" and thank you.

## 2023-10-21 NOTE — Telephone Encounter (Signed)
 Message from Edsel HERO sent at 10/21/2023 11:46 AM EST  Summary: Medication request   Patient called to get a a refill on his acyclovir  (ZOVIRAX ) 400 MG tablet. Patient states he is currently having an outbreak. Patient has not been seen in office in 2 years. Offered appointment, patient declined stating he was told he did not need to have an appointment to get refills.         Chief Complaint: HSV outbreak x 2 days Requesting a refill ASAP   Disposition: [] ED /[] Urgent Care (no appt availability in office) / [] Appointment(In office/virtual)/ []  [facility]/ [] Home Care/ [] Refused Recommended Disposition /[] [facility]/ [x]  Follow-up with PCP Additional Notes: Sending over because pt has not been in office since Dec. 2022. CPE appt made. Wait listed for earlier appt. Pt frustrated because he stated PCP said to call when he needed it. Advised pt Dr Tanda will nned to approve the refill.   Answer Assessment - Initial Assessment Questions 1. DRUG NAME: What medicine do you need to have refilled?     Acyclovir  2. REFILLS REMAINING: How many refills are remaining? (Note: The label on the medicine or pill bottle will show how many refills are remaining. If there are no refills remaining, then a renewal may be needed.)     none 4. PRESCRIBING HCP: Who prescribed it? Reason: If prescribed by specialist, call should be referred to that group.     PCP 5. SYMPTOMS: Do you have any symptoms?     HSV outbreak x 2 days  Protocols used: Medication Refill and Renewal Call-A-AH

## 2023-10-21 NOTE — Telephone Encounter (Signed)
 Summary: Medication request   Patient called to get a a refill on his acyclovir  (ZOVIRAX ) 400 MG tablet. Patient states he is currently having an outbreak. Patient has not been seen in office in 2 years. Offered appointment, patient declined stating he was told he did not need to have an appointment to get refills.      Called patient [phone] to review sx and request for medication . No answer, LVMTCB [phone].

## 2023-10-24 NOTE — Telephone Encounter (Signed)
 Requested medication (s) are due for refill today: yes  Requested medication (s) are on the active medication list: yes  Last refill:  10/02/21 expt 10/02/22  Future visit scheduled:yes  Notes to clinic:  prescription expired pt has an upcoming appt   Requested Prescriptions  Pending Prescriptions Disp Refills   acyclovir  (ZOVIRAX ) 400 MG tablet 90 tablet 3    Sig: Take 1 tab by mouth 1-2 times per day.     Antimicrobials:  Antiviral Agents - Anti-Herpetic Failed - 10/24/2023 12:08 PM      Failed - Valid encounter within last 12 months    Recent Outpatient Visits           2 years ago Screening for STDs (sexually transmitted diseases)   [place] [facility], MD   3 years ago Flank pain, acute   [facility] at [facility], Reyes SAUNDERS, MD   6 years ago STD exposure   Primary Care at [facility], Suzen Millin, FNP   7 years ago Sensation of feeling cold   Primary Care at [facility], Almarie Folks, PA-C   8 years ago Gonorrhea   [facility], Ozell Ruth, PA-C       Future Appointments             In 1 month Tanda Bleacher, MD [facility]

## 2023-12-21 ENCOUNTER — Encounter: Payer: Self-pay | Admitting: Family Medicine

## 2023-12-21 ENCOUNTER — Ambulatory Visit (INDEPENDENT_AMBULATORY_CARE_PROVIDER_SITE_OTHER): Payer: 59 | Admitting: Family Medicine

## 2023-12-21 VITALS — BP 115/77 | HR 68 | Temp 98.3°F | Resp 16 | Ht 72.0 in | Wt 181.8 lb

## 2023-12-21 DIAGNOSIS — A609 Anogenital herpesviral infection, unspecified: Secondary | ICD-10-CM | POA: Diagnosis not present

## 2023-12-21 DIAGNOSIS — Z130 Encounter for screening for diseases of the blood and blood-forming organs and certain disorders involving the immune mechanism: Secondary | ICD-10-CM

## 2023-12-21 DIAGNOSIS — Z0000 Encounter for general adult medical examination without abnormal findings: Secondary | ICD-10-CM | POA: Diagnosis not present

## 2023-12-21 DIAGNOSIS — Z13220 Encounter for screening for lipoid disorders: Secondary | ICD-10-CM | POA: Diagnosis not present

## 2023-12-21 MED ORDER — ACYCLOVIR 400 MG PO TABS: ORAL_TABLET | ORAL | 3 refills | Status: DC

## 2023-12-21 NOTE — Progress Notes (Signed)
 Established Patient Office Visit  Subjective    Patient ID: Howard Campbell, male    DOB: 06-01-1986  Age: 38 y.o. MRN: [id number]  CC:  Chief Complaint  Patient presents with   Annual Exam    medication    HPI Howard Campbell presents for routine annual exam. Patient denies acute complaints.   Outpatient Encounter Medications as of 12/21/2023  Medication Sig   [DISCONTINUED] acyclovir (ZOVIRAX) 400 MG tablet Take 1 tab by mouth 1-2 times per day.   acyclovir (ZOVIRAX) 400 MG tablet Take 1 tab by mouth 1-2 times per day.   No facility-administered encounter medications on file as of 12/21/2023.    Past Medical History:  Diagnosis Date   Allergy    Environmental and seasonal allergies     History reviewed. No pertinent surgical history.  History reviewed. No pertinent family history.  Social History   Socioeconomic History   Marital status: Legally Separated    Spouse name: Not on file   Number of children: 1   Years of education: Not on file   Highest education level: Not on file  Occupational History   Not on file  Tobacco Use   Smoking status: Never   Smokeless tobacco: Never  Vaping Use   Vaping status: Never Used  Substance and Sexual Activity   Alcohol use: Yes    Comment: social   Drug use: No   Sexual activity: Yes    Comment: Wife, been married 3-4 yrs  Other Topics Concern   Not on file  Social History Narrative   Not on file   Social Drivers of Health   Financial Resource Strain: Low Risk  (12/21/2023)   Overall Financial Resource Strain (CARDIA)    Difficulty of Paying Living Expenses: Not hard at all  Food Insecurity: No Food Insecurity (12/21/2023)   Hunger Vital Sign    Worried About Running Out of Food in the Last Year: Never true    Ran Out of Food in the Last Year: Never true  Transportation Needs: No Transportation Needs (12/21/2023)   PRAPARE - Administrator, Civil Service (Medical): No    Lack of Transportation  (Non-Medical): No  Physical Activity: Insufficiently Active (12/21/2023)   Exercise Vital Sign    Days of Exercise per Week: 3 days    Minutes of Exercise per Session: 30 min  Stress: No Stress Concern Present (12/21/2023)   [employer] of Occupational Health - Occupational Stress Questionnaire    Feeling of Stress : Not at all  Social Connections: Moderately Isolated (12/21/2023)   Social Connection and Isolation Panel [NHANES]    Frequency of Communication with Friends and Family: More than three times a week    Frequency of Social Gatherings with Friends and Family: Twice a week    Attends Religious Services: 1 to 4 times per year    Active Member of [employer] or Organizations: No    Attends Banker Meetings: Never    Marital Status: Divorced  Catering manager Violence: Not At Risk (12/21/2023)   Humiliation, Afraid, Rape, and Kick questionnaire    Fear of Current or Ex-Partner: No    Emotionally Abused: No    Physically Abused: No    Sexually Abused: No    Review of Systems  All other systems reviewed and are negative.       Objective    BP 115/77   Pulse 68   Temp 98.3 F (36.8 C) (Oral)  Resp 16   Ht 6' (1.829 m)   Wt 181 lb 12.8 oz (82.5 kg)   SpO2 98%   BMI 24.66 kg/m   Physical Exam Vitals and nursing note reviewed.  Constitutional:      General: He is not in acute distress. HENT:     Head: Normocephalic and atraumatic.     Right Ear: Tympanic membrane, ear canal and external ear normal.     Left Ear: Tympanic membrane, ear canal and external ear normal.     Nose: Nose normal.     Mouth/Throat:     Mouth: Mucous membranes are moist.     Pharynx: Oropharynx is clear.  Eyes:     Conjunctiva/sclera: Conjunctivae normal.     Pupils: Pupils are equal, round, and reactive to light.  Neck:     Thyroid: No thyromegaly.  Cardiovascular:     Rate and Rhythm: Normal rate and regular rhythm.     Heart sounds: Normal heart sounds. No murmur  heard. Pulmonary:     Effort: Pulmonary effort is normal.     Breath sounds: Normal breath sounds.  Abdominal:     General: There is no distension.     Palpations: Abdomen is soft. There is no mass.     Tenderness: There is no abdominal tenderness.     Hernia: There is no hernia in the left inguinal area or right inguinal area.  Musculoskeletal:        General: Normal range of motion.     Cervical back: Normal range of motion and neck supple.     Right lower leg: No edema.     Left lower leg: No edema.  Skin:    General: Skin is warm and dry.  Neurological:     General: No focal deficit present.     Mental Status: He is alert and oriented to person, place, and time. Mental status is at baseline.  Psychiatric:        Mood and Affect: Mood normal.        Behavior: Behavior normal.         Assessment & Plan:   Annual physical exam -     CMP14+EGFR  Screening for deficiency anemia -     CBC with Differential/Platelet  Screening for lipid disorders -     Lipid panel  HSV (herpes simplex virus) anogenital infection -     Acyclovir; Take 1 tab by mouth 1-2 times per day.  Dispense: 90 tablet; Refill: 3     No follow-ups on file.   Tommie Raymond, MD

## 2023-12-22 LAB — CMP14+EGFR
ALT: 21 IU/L (ref 0–44)
AST: 20 IU/L (ref 0–40)
Albumin: 4.7 g/dL (ref 4.1–5.1)
Alkaline Phosphatase: 57 IU/L (ref 44–121)
BUN/Creatinine Ratio: 11 (ref 9–20)
BUN: 14 mg/dL (ref 6–20)
Bilirubin Total: 0.7 mg/dL (ref 0.0–1.2)
CO2: 23 mmol/L (ref 20–29)
Calcium: 9.7 mg/dL (ref 8.7–10.2)
Chloride: 103 mmol/L (ref 96–106)
Creatinine, Ser: 1.26 mg/dL (ref 0.76–1.27)
Globulin, Total: 2.4 g/dL (ref 1.5–4.5)
Glucose: 86 mg/dL (ref 70–99)
Potassium: 4.4 mmol/L (ref 3.5–5.2)
Sodium: 142 mmol/L (ref 134–144)
Total Protein: 7.1 g/dL (ref 6.0–8.5)
eGFR: 75 mL/min/{1.73_m2} (ref >59)

## 2023-12-22 LAB — LIPID PANEL
Chol/HDL Ratio: 2.9 ratio (ref 0.0–5.0)
Cholesterol, Total: 179 mg/dL (ref 100–199)
HDL: 61 mg/dL (ref >39)
LDL Chol Calc (NIH): 101 mg/dL — ABNORMAL HIGH (ref 0–99)
Triglycerides: 95 mg/dL (ref 0–149)
VLDL Cholesterol Cal: 17 mg/dL (ref 5–40)

## 2023-12-22 LAB — CBC WITH DIFFERENTIAL/PLATELET
Basophils Absolute: 0 10*3/uL (ref 0.0–0.2)
Basos: 1 %
EOS (ABSOLUTE): 0 10*3/uL (ref 0.0–0.4)
Eos: 1 %
Hematocrit: 50.3 % (ref 37.5–51.0)
Hemoglobin: 15.8 g/dL (ref 13.0–17.7)
Immature Grans (Abs): 0 10*3/uL (ref 0.0–0.1)
Immature Granulocytes: 1 %
Lymphocytes Absolute: 1.4 10*3/uL (ref 0.7–3.1)
Lymphs: 40 %
MCH: 27.2 pg (ref 26.6–33.0)
MCHC: 31.4 g/dL — ABNORMAL LOW (ref 31.5–35.7)
MCV: 87 fL (ref 79–97)
Monocytes Absolute: 0.3 10*3/uL (ref 0.1–0.9)
Monocytes: 7 %
Neutrophils Absolute: 1.8 10*3/uL (ref 1.4–7.0)
Neutrophils: 50 %
Platelets: 229 10*3/uL (ref 150–450)
RBC: 5.8 x10E6/uL (ref 4.14–5.80)
RDW: 13.3 % (ref 11.6–15.4)
WBC: 3.6 10*3/uL (ref 3.4–10.8)

## 2024-03-29 ENCOUNTER — Ambulatory Visit: Payer: Self-pay | Admitting: Family Medicine

## 2024-10-14 ENCOUNTER — Other Ambulatory Visit: Payer: Self-pay | Admitting: Family Medicine

## 2024-10-14 DIAGNOSIS — A609 Anogenital herpesviral infection, unspecified: Secondary | ICD-10-CM

## 2024-10-15 NOTE — Telephone Encounter (Signed)
 Complete

## 2024-12-26 ENCOUNTER — Encounter: Admitting: Family Medicine
